# Patient Record
Sex: Male | Born: 1935
Health system: Southern US, Community
[De-identification: ages and names within clinical notes are randomized; demographics above are authoritative.]

## PROBLEM LIST (undated history)

## (undated) DIAGNOSIS — T8859XA Other complications of anesthesia, initial encounter: Secondary | ICD-10-CM

## (undated) DIAGNOSIS — E119 Type 2 diabetes mellitus without complications: Secondary | ICD-10-CM

## (undated) DIAGNOSIS — F0390 Unspecified dementia without behavioral disturbance: Secondary | ICD-10-CM

## (undated) DIAGNOSIS — T4145XA Adverse effect of unspecified anesthetic, initial encounter: Secondary | ICD-10-CM

## (undated) DIAGNOSIS — I509 Heart failure, unspecified: Secondary | ICD-10-CM

## (undated) DIAGNOSIS — R0600 Dyspnea, unspecified: Secondary | ICD-10-CM

## (undated) DIAGNOSIS — I4891 Unspecified atrial fibrillation: Secondary | ICD-10-CM

## (undated) DIAGNOSIS — I639 Cerebral infarction, unspecified: Secondary | ICD-10-CM

## (undated) HISTORY — PX: CARDIAC CATHETERIZATION: SHX172

## (undated) HISTORY — PX: COLONOSCOPY: SHX174

## (undated) HISTORY — PX: CATARACT EXTRACTION: SUR2

## (undated) HISTORY — PX: TONSILLECTOMY: SUR1361

## (undated) HISTORY — PX: APPENDECTOMY: SHX54

---

## 2005-03-20 ENCOUNTER — Ambulatory Visit: Payer: Self-pay | Admitting: Gastroenterology

## 2005-04-07 ENCOUNTER — Ambulatory Visit: Payer: Self-pay | Admitting: Gastroenterology

## 2012-03-31 ENCOUNTER — Encounter: Payer: Self-pay | Admitting: Gastroenterology

## 2012-12-29 ENCOUNTER — Encounter: Payer: Self-pay | Admitting: Gastroenterology

## 2014-10-03 DIAGNOSIS — C44311 Basal cell carcinoma of skin of nose: Secondary | ICD-10-CM | POA: Diagnosis not present

## 2014-10-03 DIAGNOSIS — L219 Seborrheic dermatitis, unspecified: Secondary | ICD-10-CM | POA: Diagnosis not present

## 2014-11-05 DIAGNOSIS — C44112 Basal cell carcinoma of skin of right eyelid, including canthus: Secondary | ICD-10-CM | POA: Diagnosis not present

## 2014-11-05 DIAGNOSIS — C44311 Basal cell carcinoma of skin of nose: Secondary | ICD-10-CM | POA: Diagnosis not present

## 2014-11-05 DIAGNOSIS — L570 Actinic keratosis: Secondary | ICD-10-CM | POA: Diagnosis not present

## 2014-11-26 DIAGNOSIS — L821 Other seborrheic keratosis: Secondary | ICD-10-CM | POA: Diagnosis not present

## 2014-11-26 DIAGNOSIS — L570 Actinic keratosis: Secondary | ICD-10-CM | POA: Diagnosis not present

## 2014-11-26 DIAGNOSIS — C44311 Basal cell carcinoma of skin of nose: Secondary | ICD-10-CM | POA: Diagnosis not present

## 2014-11-26 DIAGNOSIS — C44112 Basal cell carcinoma of skin of right eyelid, including canthus: Secondary | ICD-10-CM | POA: Diagnosis not present

## 2014-12-13 DIAGNOSIS — I10 Essential (primary) hypertension: Secondary | ICD-10-CM | POA: Diagnosis not present

## 2014-12-13 DIAGNOSIS — E785 Hyperlipidemia, unspecified: Secondary | ICD-10-CM | POA: Diagnosis not present

## 2014-12-13 DIAGNOSIS — E119 Type 2 diabetes mellitus without complications: Secondary | ICD-10-CM | POA: Diagnosis not present

## 2014-12-24 DIAGNOSIS — E119 Type 2 diabetes mellitus without complications: Secondary | ICD-10-CM | POA: Diagnosis not present

## 2014-12-24 DIAGNOSIS — I10 Essential (primary) hypertension: Secondary | ICD-10-CM | POA: Diagnosis not present

## 2015-03-12 DIAGNOSIS — C61 Malignant neoplasm of prostate: Secondary | ICD-10-CM | POA: Diagnosis not present

## 2015-03-19 DIAGNOSIS — C61 Malignant neoplasm of prostate: Secondary | ICD-10-CM | POA: Diagnosis not present

## 2015-03-21 DIAGNOSIS — I10 Essential (primary) hypertension: Secondary | ICD-10-CM | POA: Diagnosis not present

## 2015-05-16 DIAGNOSIS — I5022 Chronic systolic (congestive) heart failure: Secondary | ICD-10-CM | POA: Diagnosis not present

## 2015-05-16 DIAGNOSIS — I429 Cardiomyopathy, unspecified: Secondary | ICD-10-CM | POA: Diagnosis not present

## 2015-05-16 DIAGNOSIS — I10 Essential (primary) hypertension: Secondary | ICD-10-CM | POA: Diagnosis not present

## 2015-05-16 DIAGNOSIS — I209 Angina pectoris, unspecified: Secondary | ICD-10-CM | POA: Diagnosis not present

## 2015-05-16 DIAGNOSIS — I2510 Atherosclerotic heart disease of native coronary artery without angina pectoris: Secondary | ICD-10-CM | POA: Diagnosis not present

## 2015-06-06 DIAGNOSIS — I083 Combined rheumatic disorders of mitral, aortic and tricuspid valves: Secondary | ICD-10-CM | POA: Diagnosis not present

## 2015-06-06 DIAGNOSIS — I501 Left ventricular failure: Secondary | ICD-10-CM | POA: Diagnosis not present

## 2015-06-26 DIAGNOSIS — Z23 Encounter for immunization: Secondary | ICD-10-CM | POA: Diagnosis not present

## 2015-06-26 DIAGNOSIS — Z0000 Encounter for general adult medical examination without abnormal findings: Secondary | ICD-10-CM | POA: Diagnosis not present

## 2015-06-26 DIAGNOSIS — E782 Mixed hyperlipidemia: Secondary | ICD-10-CM | POA: Diagnosis not present

## 2015-06-26 DIAGNOSIS — Z1389 Encounter for screening for other disorder: Secondary | ICD-10-CM | POA: Diagnosis not present

## 2015-06-26 DIAGNOSIS — I10 Essential (primary) hypertension: Secondary | ICD-10-CM | POA: Diagnosis not present

## 2015-06-26 DIAGNOSIS — I2510 Atherosclerotic heart disease of native coronary artery without angina pectoris: Secondary | ICD-10-CM | POA: Diagnosis not present

## 2015-06-26 DIAGNOSIS — E1129 Type 2 diabetes mellitus with other diabetic kidney complication: Secondary | ICD-10-CM | POA: Diagnosis not present

## 2015-10-01 DIAGNOSIS — C61 Malignant neoplasm of prostate: Secondary | ICD-10-CM | POA: Diagnosis not present

## 2015-10-08 DIAGNOSIS — C61 Malignant neoplasm of prostate: Secondary | ICD-10-CM | POA: Diagnosis not present

## 2015-10-09 DIAGNOSIS — I5022 Chronic systolic (congestive) heart failure: Secondary | ICD-10-CM | POA: Diagnosis not present

## 2015-10-09 DIAGNOSIS — E1129 Type 2 diabetes mellitus with other diabetic kidney complication: Secondary | ICD-10-CM | POA: Diagnosis not present

## 2015-10-09 DIAGNOSIS — R809 Proteinuria, unspecified: Secondary | ICD-10-CM | POA: Diagnosis not present

## 2015-10-09 DIAGNOSIS — E7800 Pure hypercholesterolemia, unspecified: Secondary | ICD-10-CM | POA: Diagnosis not present

## 2015-10-23 DIAGNOSIS — I5022 Chronic systolic (congestive) heart failure: Secondary | ICD-10-CM | POA: Diagnosis not present

## 2015-11-27 DIAGNOSIS — L821 Other seborrheic keratosis: Secondary | ICD-10-CM | POA: Diagnosis not present

## 2015-11-27 DIAGNOSIS — L578 Other skin changes due to chronic exposure to nonionizing radiation: Secondary | ICD-10-CM | POA: Diagnosis not present

## 2015-11-27 DIAGNOSIS — D1801 Hemangioma of skin and subcutaneous tissue: Secondary | ICD-10-CM | POA: Diagnosis not present

## 2015-11-27 DIAGNOSIS — L820 Inflamed seborrheic keratosis: Secondary | ICD-10-CM | POA: Diagnosis not present

## 2016-01-22 DIAGNOSIS — K219 Gastro-esophageal reflux disease without esophagitis: Secondary | ICD-10-CM | POA: Diagnosis not present

## 2016-01-22 DIAGNOSIS — E7800 Pure hypercholesterolemia, unspecified: Secondary | ICD-10-CM | POA: Diagnosis not present

## 2016-01-22 DIAGNOSIS — E1129 Type 2 diabetes mellitus with other diabetic kidney complication: Secondary | ICD-10-CM | POA: Diagnosis not present

## 2016-01-22 DIAGNOSIS — I5022 Chronic systolic (congestive) heart failure: Secondary | ICD-10-CM | POA: Diagnosis not present

## 2016-01-22 DIAGNOSIS — R809 Proteinuria, unspecified: Secondary | ICD-10-CM | POA: Diagnosis not present

## 2016-02-19 DIAGNOSIS — I5022 Chronic systolic (congestive) heart failure: Secondary | ICD-10-CM | POA: Diagnosis not present

## 2016-02-19 DIAGNOSIS — I208 Other forms of angina pectoris: Secondary | ICD-10-CM | POA: Diagnosis not present

## 2016-02-19 DIAGNOSIS — I493 Ventricular premature depolarization: Secondary | ICD-10-CM | POA: Diagnosis not present

## 2016-02-19 DIAGNOSIS — I10 Essential (primary) hypertension: Secondary | ICD-10-CM | POA: Diagnosis not present

## 2016-02-19 DIAGNOSIS — I2510 Atherosclerotic heart disease of native coronary artery without angina pectoris: Secondary | ICD-10-CM | POA: Diagnosis not present

## 2016-02-20 DIAGNOSIS — I5022 Chronic systolic (congestive) heart failure: Secondary | ICD-10-CM | POA: Diagnosis not present

## 2016-02-20 DIAGNOSIS — I2510 Atherosclerotic heart disease of native coronary artery without angina pectoris: Secondary | ICD-10-CM | POA: Diagnosis not present

## 2016-02-20 DIAGNOSIS — Z01818 Encounter for other preprocedural examination: Secondary | ICD-10-CM | POA: Diagnosis not present

## 2016-02-21 DIAGNOSIS — Z955 Presence of coronary angioplasty implant and graft: Secondary | ICD-10-CM | POA: Diagnosis not present

## 2016-02-21 DIAGNOSIS — I447 Left bundle-branch block, unspecified: Secondary | ICD-10-CM | POA: Diagnosis not present

## 2016-02-21 DIAGNOSIS — R569 Unspecified convulsions: Secondary | ICD-10-CM | POA: Diagnosis not present

## 2016-02-21 DIAGNOSIS — R5381 Other malaise: Secondary | ICD-10-CM | POA: Diagnosis not present

## 2016-02-21 DIAGNOSIS — Z79818 Long term (current) use of other agents affecting estrogen receptors and estrogen levels: Secondary | ICD-10-CM | POA: Diagnosis not present

## 2016-02-21 DIAGNOSIS — R079 Chest pain, unspecified: Secondary | ICD-10-CM | POA: Diagnosis not present

## 2016-02-21 DIAGNOSIS — E876 Hypokalemia: Secondary | ICD-10-CM | POA: Diagnosis not present

## 2016-02-21 DIAGNOSIS — I2510 Atherosclerotic heart disease of native coronary artery without angina pectoris: Secondary | ICD-10-CM | POA: Diagnosis not present

## 2016-02-21 DIAGNOSIS — R4182 Altered mental status, unspecified: Secondary | ICD-10-CM | POA: Diagnosis not present

## 2016-02-21 DIAGNOSIS — E871 Hypo-osmolality and hyponatremia: Secondary | ICD-10-CM | POA: Diagnosis not present

## 2016-02-21 DIAGNOSIS — I10 Essential (primary) hypertension: Secondary | ICD-10-CM | POA: Diagnosis not present

## 2016-02-21 DIAGNOSIS — E118 Type 2 diabetes mellitus with unspecified complications: Secondary | ICD-10-CM | POA: Diagnosis not present

## 2016-02-21 DIAGNOSIS — R413 Other amnesia: Secondary | ICD-10-CM | POA: Diagnosis not present

## 2016-02-21 DIAGNOSIS — Z7982 Long term (current) use of aspirin: Secondary | ICD-10-CM | POA: Diagnosis not present

## 2016-02-21 DIAGNOSIS — I639 Cerebral infarction, unspecified: Secondary | ICD-10-CM | POA: Diagnosis not present

## 2016-02-21 DIAGNOSIS — I110 Hypertensive heart disease with heart failure: Secondary | ICD-10-CM | POA: Diagnosis not present

## 2016-02-21 DIAGNOSIS — E785 Hyperlipidemia, unspecified: Secondary | ICD-10-CM | POA: Diagnosis not present

## 2016-02-21 DIAGNOSIS — Z7902 Long term (current) use of antithrombotics/antiplatelets: Secondary | ICD-10-CM | POA: Diagnosis not present

## 2016-02-21 DIAGNOSIS — E119 Type 2 diabetes mellitus without complications: Secondary | ICD-10-CM | POA: Diagnosis not present

## 2016-02-21 DIAGNOSIS — I420 Dilated cardiomyopathy: Secondary | ICD-10-CM | POA: Diagnosis not present

## 2016-02-21 DIAGNOSIS — D539 Nutritional anemia, unspecified: Secondary | ICD-10-CM | POA: Diagnosis not present

## 2016-02-21 DIAGNOSIS — R000 Tachycardia, unspecified: Secondary | ICD-10-CM | POA: Diagnosis not present

## 2016-02-21 DIAGNOSIS — R410 Disorientation, unspecified: Secondary | ICD-10-CM | POA: Diagnosis not present

## 2016-02-21 DIAGNOSIS — Z452 Encounter for adjustment and management of vascular access device: Secondary | ICD-10-CM | POA: Diagnosis not present

## 2016-02-21 DIAGNOSIS — Z79899 Other long term (current) drug therapy: Secondary | ICD-10-CM | POA: Diagnosis not present

## 2016-02-21 DIAGNOSIS — E7800 Pure hypercholesterolemia, unspecified: Secondary | ICD-10-CM | POA: Diagnosis not present

## 2016-02-21 DIAGNOSIS — I493 Ventricular premature depolarization: Secondary | ICD-10-CM | POA: Diagnosis not present

## 2016-02-21 DIAGNOSIS — I5022 Chronic systolic (congestive) heart failure: Secondary | ICD-10-CM | POA: Diagnosis not present

## 2016-02-21 DIAGNOSIS — Z7984 Long term (current) use of oral hypoglycemic drugs: Secondary | ICD-10-CM | POA: Diagnosis not present

## 2016-02-21 DIAGNOSIS — Z8673 Personal history of transient ischemic attack (TIA), and cerebral infarction without residual deficits: Secondary | ICD-10-CM | POA: Diagnosis not present

## 2016-02-21 DIAGNOSIS — I25119 Atherosclerotic heart disease of native coronary artery with unspecified angina pectoris: Secondary | ICD-10-CM | POA: Diagnosis not present

## 2016-02-21 DIAGNOSIS — I25118 Atherosclerotic heart disease of native coronary artery with other forms of angina pectoris: Secondary | ICD-10-CM | POA: Diagnosis not present

## 2016-02-25 DIAGNOSIS — I69318 Other symptoms and signs involving cognitive functions following cerebral infarction: Secondary | ICD-10-CM | POA: Diagnosis not present

## 2016-02-25 DIAGNOSIS — I2510 Atherosclerotic heart disease of native coronary artery without angina pectoris: Secondary | ICD-10-CM | POA: Diagnosis not present

## 2016-02-25 DIAGNOSIS — Z7984 Long term (current) use of oral hypoglycemic drugs: Secondary | ICD-10-CM | POA: Diagnosis not present

## 2016-02-25 DIAGNOSIS — I110 Hypertensive heart disease with heart failure: Secondary | ICD-10-CM | POA: Diagnosis not present

## 2016-02-25 DIAGNOSIS — E119 Type 2 diabetes mellitus without complications: Secondary | ICD-10-CM | POA: Diagnosis not present

## 2016-02-25 DIAGNOSIS — I5022 Chronic systolic (congestive) heart failure: Secondary | ICD-10-CM | POA: Diagnosis not present

## 2016-02-25 DIAGNOSIS — Z7982 Long term (current) use of aspirin: Secondary | ICD-10-CM | POA: Diagnosis not present

## 2016-02-27 DIAGNOSIS — I5022 Chronic systolic (congestive) heart failure: Secondary | ICD-10-CM | POA: Diagnosis not present

## 2016-02-27 DIAGNOSIS — I69318 Other symptoms and signs involving cognitive functions following cerebral infarction: Secondary | ICD-10-CM | POA: Diagnosis not present

## 2016-02-27 DIAGNOSIS — E119 Type 2 diabetes mellitus without complications: Secondary | ICD-10-CM | POA: Diagnosis not present

## 2016-02-27 DIAGNOSIS — Z7984 Long term (current) use of oral hypoglycemic drugs: Secondary | ICD-10-CM | POA: Diagnosis not present

## 2016-02-27 DIAGNOSIS — Z7982 Long term (current) use of aspirin: Secondary | ICD-10-CM | POA: Diagnosis not present

## 2016-02-27 DIAGNOSIS — I2510 Atherosclerotic heart disease of native coronary artery without angina pectoris: Secondary | ICD-10-CM | POA: Diagnosis not present

## 2016-02-27 DIAGNOSIS — I110 Hypertensive heart disease with heart failure: Secondary | ICD-10-CM | POA: Diagnosis not present

## 2016-03-02 DIAGNOSIS — I5022 Chronic systolic (congestive) heart failure: Secondary | ICD-10-CM | POA: Diagnosis not present

## 2016-03-02 DIAGNOSIS — I2510 Atherosclerotic heart disease of native coronary artery without angina pectoris: Secondary | ICD-10-CM | POA: Diagnosis not present

## 2016-03-02 DIAGNOSIS — I69318 Other symptoms and signs involving cognitive functions following cerebral infarction: Secondary | ICD-10-CM | POA: Diagnosis not present

## 2016-03-02 DIAGNOSIS — E119 Type 2 diabetes mellitus without complications: Secondary | ICD-10-CM | POA: Diagnosis not present

## 2016-03-02 DIAGNOSIS — Z7982 Long term (current) use of aspirin: Secondary | ICD-10-CM | POA: Diagnosis not present

## 2016-03-02 DIAGNOSIS — I110 Hypertensive heart disease with heart failure: Secondary | ICD-10-CM | POA: Diagnosis not present

## 2016-03-02 DIAGNOSIS — Z7984 Long term (current) use of oral hypoglycemic drugs: Secondary | ICD-10-CM | POA: Diagnosis not present

## 2016-03-03 DIAGNOSIS — I69318 Other symptoms and signs involving cognitive functions following cerebral infarction: Secondary | ICD-10-CM | POA: Diagnosis not present

## 2016-03-03 DIAGNOSIS — I679 Cerebrovascular disease, unspecified: Secondary | ICD-10-CM | POA: Diagnosis not present

## 2016-03-03 DIAGNOSIS — I2510 Atherosclerotic heart disease of native coronary artery without angina pectoris: Secondary | ICD-10-CM | POA: Diagnosis not present

## 2016-03-03 DIAGNOSIS — I5022 Chronic systolic (congestive) heart failure: Secondary | ICD-10-CM | POA: Diagnosis not present

## 2016-03-03 DIAGNOSIS — I110 Hypertensive heart disease with heart failure: Secondary | ICD-10-CM | POA: Diagnosis not present

## 2016-03-03 DIAGNOSIS — Z7984 Long term (current) use of oral hypoglycemic drugs: Secondary | ICD-10-CM | POA: Diagnosis not present

## 2016-03-03 DIAGNOSIS — E119 Type 2 diabetes mellitus without complications: Secondary | ICD-10-CM | POA: Diagnosis not present

## 2016-03-03 DIAGNOSIS — Z7982 Long term (current) use of aspirin: Secondary | ICD-10-CM | POA: Diagnosis not present

## 2016-03-11 DIAGNOSIS — Z79899 Other long term (current) drug therapy: Secondary | ICD-10-CM | POA: Diagnosis not present

## 2016-03-11 DIAGNOSIS — R4182 Altered mental status, unspecified: Secondary | ICD-10-CM | POA: Diagnosis not present

## 2016-03-11 DIAGNOSIS — E119 Type 2 diabetes mellitus without complications: Secondary | ICD-10-CM | POA: Diagnosis not present

## 2016-03-11 DIAGNOSIS — I5022 Chronic systolic (congestive) heart failure: Secondary | ICD-10-CM | POA: Diagnosis not present

## 2016-03-11 DIAGNOSIS — I69318 Other symptoms and signs involving cognitive functions following cerebral infarction: Secondary | ICD-10-CM | POA: Diagnosis not present

## 2016-03-11 DIAGNOSIS — E860 Dehydration: Secondary | ICD-10-CM | POA: Diagnosis not present

## 2016-03-11 DIAGNOSIS — I110 Hypertensive heart disease with heart failure: Secondary | ICD-10-CM | POA: Diagnosis not present

## 2016-03-11 DIAGNOSIS — R402411 Glasgow coma scale score 13-15, in the field [EMT or ambulance]: Secondary | ICD-10-CM | POA: Diagnosis not present

## 2016-03-11 DIAGNOSIS — Z7982 Long term (current) use of aspirin: Secondary | ICD-10-CM | POA: Diagnosis not present

## 2016-03-11 DIAGNOSIS — I10 Essential (primary) hypertension: Secondary | ICD-10-CM | POA: Diagnosis not present

## 2016-03-11 DIAGNOSIS — G459 Transient cerebral ischemic attack, unspecified: Secondary | ICD-10-CM | POA: Diagnosis not present

## 2016-03-11 DIAGNOSIS — Z7984 Long term (current) use of oral hypoglycemic drugs: Secondary | ICD-10-CM | POA: Diagnosis not present

## 2016-03-11 DIAGNOSIS — I2510 Atherosclerotic heart disease of native coronary artery without angina pectoris: Secondary | ICD-10-CM | POA: Diagnosis not present

## 2016-03-13 DIAGNOSIS — I679 Cerebrovascular disease, unspecified: Secondary | ICD-10-CM | POA: Diagnosis not present

## 2016-03-17 DIAGNOSIS — I2510 Atherosclerotic heart disease of native coronary artery without angina pectoris: Secondary | ICD-10-CM | POA: Diagnosis not present

## 2016-03-17 DIAGNOSIS — R0989 Other specified symptoms and signs involving the circulatory and respiratory systems: Secondary | ICD-10-CM | POA: Diagnosis not present

## 2016-03-17 DIAGNOSIS — I429 Cardiomyopathy, unspecified: Secondary | ICD-10-CM | POA: Diagnosis not present

## 2016-03-17 DIAGNOSIS — I493 Ventricular premature depolarization: Secondary | ICD-10-CM | POA: Diagnosis not present

## 2016-03-18 DIAGNOSIS — I2510 Atherosclerotic heart disease of native coronary artery without angina pectoris: Secondary | ICD-10-CM | POA: Diagnosis not present

## 2016-03-18 DIAGNOSIS — I429 Cardiomyopathy, unspecified: Secondary | ICD-10-CM | POA: Diagnosis not present

## 2016-03-18 DIAGNOSIS — I493 Ventricular premature depolarization: Secondary | ICD-10-CM | POA: Diagnosis not present

## 2016-03-18 DIAGNOSIS — R0989 Other specified symptoms and signs involving the circulatory and respiratory systems: Secondary | ICD-10-CM | POA: Diagnosis not present

## 2016-03-19 DIAGNOSIS — I69318 Other symptoms and signs involving cognitive functions following cerebral infarction: Secondary | ICD-10-CM | POA: Diagnosis not present

## 2016-03-19 DIAGNOSIS — I110 Hypertensive heart disease with heart failure: Secondary | ICD-10-CM | POA: Diagnosis not present

## 2016-03-19 DIAGNOSIS — E119 Type 2 diabetes mellitus without complications: Secondary | ICD-10-CM | POA: Diagnosis not present

## 2016-03-19 DIAGNOSIS — I5022 Chronic systolic (congestive) heart failure: Secondary | ICD-10-CM | POA: Diagnosis not present

## 2016-03-19 DIAGNOSIS — I2510 Atherosclerotic heart disease of native coronary artery without angina pectoris: Secondary | ICD-10-CM | POA: Diagnosis not present

## 2016-03-19 DIAGNOSIS — Z7982 Long term (current) use of aspirin: Secondary | ICD-10-CM | POA: Diagnosis not present

## 2016-03-19 DIAGNOSIS — Z7984 Long term (current) use of oral hypoglycemic drugs: Secondary | ICD-10-CM | POA: Diagnosis not present

## 2016-03-31 DIAGNOSIS — Z7984 Long term (current) use of oral hypoglycemic drugs: Secondary | ICD-10-CM | POA: Diagnosis not present

## 2016-03-31 DIAGNOSIS — I5022 Chronic systolic (congestive) heart failure: Secondary | ICD-10-CM | POA: Diagnosis not present

## 2016-03-31 DIAGNOSIS — Z7982 Long term (current) use of aspirin: Secondary | ICD-10-CM | POA: Diagnosis not present

## 2016-03-31 DIAGNOSIS — E119 Type 2 diabetes mellitus without complications: Secondary | ICD-10-CM | POA: Diagnosis not present

## 2016-03-31 DIAGNOSIS — I2510 Atherosclerotic heart disease of native coronary artery without angina pectoris: Secondary | ICD-10-CM | POA: Diagnosis not present

## 2016-03-31 DIAGNOSIS — I69318 Other symptoms and signs involving cognitive functions following cerebral infarction: Secondary | ICD-10-CM | POA: Diagnosis not present

## 2016-03-31 DIAGNOSIS — I110 Hypertensive heart disease with heart failure: Secondary | ICD-10-CM | POA: Diagnosis not present

## 2016-04-08 DIAGNOSIS — I429 Cardiomyopathy, unspecified: Secondary | ICD-10-CM | POA: Diagnosis not present

## 2016-04-08 DIAGNOSIS — R9431 Abnormal electrocardiogram [ECG] [EKG]: Secondary | ICD-10-CM | POA: Diagnosis not present

## 2016-04-08 DIAGNOSIS — I6522 Occlusion and stenosis of left carotid artery: Secondary | ICD-10-CM | POA: Diagnosis not present

## 2016-04-08 DIAGNOSIS — I6300 Cerebral infarction due to thrombosis of unspecified precerebral artery: Secondary | ICD-10-CM | POA: Diagnosis not present

## 2016-04-08 DIAGNOSIS — R413 Other amnesia: Secondary | ICD-10-CM | POA: Diagnosis not present

## 2016-04-08 DIAGNOSIS — R938 Abnormal findings on diagnostic imaging of other specified body structures: Secondary | ICD-10-CM | POA: Diagnosis not present

## 2016-04-14 DIAGNOSIS — C61 Malignant neoplasm of prostate: Secondary | ICD-10-CM | POA: Diagnosis not present

## 2016-04-17 ENCOUNTER — Other Ambulatory Visit (HOSPITAL_COMMUNITY): Payer: Self-pay | Admitting: Cardiology

## 2016-04-17 ENCOUNTER — Telehealth (HOSPITAL_COMMUNITY): Payer: Self-pay | Admitting: Interventional Radiology

## 2016-04-17 DIAGNOSIS — I771 Stricture of artery: Secondary | ICD-10-CM

## 2016-04-17 NOTE — Telephone Encounter (Signed)
Called pt to schedule consult with Dev for stenosis, left VM. JM

## 2016-04-22 DIAGNOSIS — C61 Malignant neoplasm of prostate: Secondary | ICD-10-CM | POA: Diagnosis not present

## 2016-04-22 DIAGNOSIS — I779 Disorder of arteries and arterioles, unspecified: Secondary | ICD-10-CM | POA: Diagnosis not present

## 2016-04-27 DIAGNOSIS — E1151 Type 2 diabetes mellitus with diabetic peripheral angiopathy without gangrene: Secondary | ICD-10-CM | POA: Diagnosis not present

## 2016-04-27 DIAGNOSIS — E785 Hyperlipidemia, unspecified: Secondary | ICD-10-CM | POA: Diagnosis not present

## 2016-04-27 DIAGNOSIS — I871 Compression of vein: Secondary | ICD-10-CM | POA: Diagnosis not present

## 2016-04-27 DIAGNOSIS — I10 Essential (primary) hypertension: Secondary | ICD-10-CM | POA: Diagnosis not present

## 2016-04-27 DIAGNOSIS — I70213 Atherosclerosis of native arteries of extremities with intermittent claudication, bilateral legs: Secondary | ICD-10-CM | POA: Diagnosis not present

## 2016-04-28 ENCOUNTER — Telehealth (HOSPITAL_COMMUNITY): Payer: Self-pay | Admitting: Interventional Radiology

## 2016-04-28 NOTE — Telephone Encounter (Signed)
Called pt, spoke to pt's wife. She stated that the pt had seen Dr. Maryjean Morn in [facility] and does not wish to see Dr. Estanislado Pandy. JM

## 2016-05-19 DIAGNOSIS — I5022 Chronic systolic (congestive) heart failure: Secondary | ICD-10-CM | POA: Diagnosis not present

## 2016-05-19 DIAGNOSIS — Z23 Encounter for immunization: Secondary | ICD-10-CM | POA: Diagnosis not present

## 2016-05-19 DIAGNOSIS — E119 Type 2 diabetes mellitus without complications: Secondary | ICD-10-CM | POA: Diagnosis not present

## 2016-05-19 DIAGNOSIS — I10 Essential (primary) hypertension: Secondary | ICD-10-CM | POA: Diagnosis not present

## 2016-05-20 DIAGNOSIS — I639 Cerebral infarction, unspecified: Secondary | ICD-10-CM | POA: Diagnosis not present

## 2016-05-20 DIAGNOSIS — R413 Other amnesia: Secondary | ICD-10-CM | POA: Diagnosis not present

## 2016-06-08 DIAGNOSIS — I871 Compression of vein: Secondary | ICD-10-CM | POA: Diagnosis not present

## 2016-06-08 DIAGNOSIS — I70213 Atherosclerosis of native arteries of extremities with intermittent claudication, bilateral legs: Secondary | ICD-10-CM | POA: Diagnosis not present

## 2016-06-08 DIAGNOSIS — R0989 Other specified symptoms and signs involving the circulatory and respiratory systems: Secondary | ICD-10-CM | POA: Diagnosis not present

## 2016-06-08 DIAGNOSIS — I6523 Occlusion and stenosis of bilateral carotid arteries: Secondary | ICD-10-CM | POA: Diagnosis not present

## 2016-06-17 DIAGNOSIS — R0989 Other specified symptoms and signs involving the circulatory and respiratory systems: Secondary | ICD-10-CM | POA: Diagnosis not present

## 2016-06-17 DIAGNOSIS — I420 Dilated cardiomyopathy: Secondary | ICD-10-CM | POA: Diagnosis not present

## 2016-06-17 DIAGNOSIS — I5022 Chronic systolic (congestive) heart failure: Secondary | ICD-10-CM | POA: Diagnosis not present

## 2016-06-17 DIAGNOSIS — I2510 Atherosclerotic heart disease of native coronary artery without angina pectoris: Secondary | ICD-10-CM | POA: Diagnosis not present

## 2016-06-17 DIAGNOSIS — R972 Elevated prostate specific antigen [PSA]: Secondary | ICD-10-CM | POA: Diagnosis not present

## 2016-07-31 DIAGNOSIS — R339 Retention of urine, unspecified: Secondary | ICD-10-CM | POA: Diagnosis not present

## 2016-08-19 DIAGNOSIS — E7800 Pure hypercholesterolemia, unspecified: Secondary | ICD-10-CM | POA: Diagnosis not present

## 2016-08-19 DIAGNOSIS — I10 Essential (primary) hypertension: Secondary | ICD-10-CM | POA: Diagnosis not present

## 2016-08-19 DIAGNOSIS — E119 Type 2 diabetes mellitus without complications: Secondary | ICD-10-CM | POA: Diagnosis not present

## 2016-08-19 DIAGNOSIS — I2510 Atherosclerotic heart disease of native coronary artery without angina pectoris: Secondary | ICD-10-CM | POA: Diagnosis not present

## 2016-08-19 DIAGNOSIS — E1129 Type 2 diabetes mellitus with other diabetic kidney complication: Secondary | ICD-10-CM | POA: Diagnosis not present

## 2016-08-19 DIAGNOSIS — Z1389 Encounter for screening for other disorder: Secondary | ICD-10-CM | POA: Diagnosis not present

## 2016-08-19 DIAGNOSIS — K219 Gastro-esophageal reflux disease without esophagitis: Secondary | ICD-10-CM | POA: Diagnosis not present

## 2016-08-19 DIAGNOSIS — Z0000 Encounter for general adult medical examination without abnormal findings: Secondary | ICD-10-CM | POA: Diagnosis not present

## 2016-08-27 DIAGNOSIS — M170 Bilateral primary osteoarthritis of knee: Secondary | ICD-10-CM | POA: Diagnosis not present

## 2016-09-01 DIAGNOSIS — D649 Anemia, unspecified: Secondary | ICD-10-CM | POA: Diagnosis not present

## 2016-09-02 DIAGNOSIS — Z1212 Encounter for screening for malignant neoplasm of rectum: Secondary | ICD-10-CM | POA: Diagnosis not present

## 2016-09-02 DIAGNOSIS — M170 Bilateral primary osteoarthritis of knee: Secondary | ICD-10-CM | POA: Diagnosis not present

## 2016-10-28 DIAGNOSIS — I871 Compression of vein: Secondary | ICD-10-CM | POA: Diagnosis not present

## 2016-10-28 DIAGNOSIS — I6523 Occlusion and stenosis of bilateral carotid arteries: Secondary | ICD-10-CM | POA: Diagnosis not present

## 2016-11-19 DIAGNOSIS — I472 Ventricular tachycardia: Secondary | ICD-10-CM | POA: Diagnosis not present

## 2016-11-19 DIAGNOSIS — I10 Essential (primary) hypertension: Secondary | ICD-10-CM | POA: Diagnosis not present

## 2016-11-19 DIAGNOSIS — I420 Dilated cardiomyopathy: Secondary | ICD-10-CM | POA: Diagnosis not present

## 2016-11-25 DIAGNOSIS — I10 Essential (primary) hypertension: Secondary | ICD-10-CM | POA: Diagnosis not present

## 2016-11-25 DIAGNOSIS — D649 Anemia, unspecified: Secondary | ICD-10-CM | POA: Diagnosis not present

## 2016-11-25 DIAGNOSIS — E119 Type 2 diabetes mellitus without complications: Secondary | ICD-10-CM | POA: Diagnosis not present

## 2016-11-25 DIAGNOSIS — E7800 Pure hypercholesterolemia, unspecified: Secondary | ICD-10-CM | POA: Diagnosis not present

## 2016-12-02 DIAGNOSIS — L821 Other seborrheic keratosis: Secondary | ICD-10-CM | POA: Diagnosis not present

## 2016-12-02 DIAGNOSIS — C44311 Basal cell carcinoma of skin of nose: Secondary | ICD-10-CM | POA: Diagnosis not present

## 2016-12-02 DIAGNOSIS — L578 Other skin changes due to chronic exposure to nonionizing radiation: Secondary | ICD-10-CM | POA: Diagnosis not present

## 2016-12-02 DIAGNOSIS — L570 Actinic keratosis: Secondary | ICD-10-CM | POA: Diagnosis not present

## 2016-12-15 DIAGNOSIS — C61 Malignant neoplasm of prostate: Secondary | ICD-10-CM | POA: Diagnosis not present

## 2016-12-22 DIAGNOSIS — C61 Malignant neoplasm of prostate: Secondary | ICD-10-CM | POA: Diagnosis not present

## 2016-12-24 DIAGNOSIS — D649 Anemia, unspecified: Secondary | ICD-10-CM | POA: Diagnosis not present

## 2016-12-24 DIAGNOSIS — D510 Vitamin B12 deficiency anemia due to intrinsic factor deficiency: Secondary | ICD-10-CM | POA: Diagnosis not present

## 2016-12-24 DIAGNOSIS — K210 Gastro-esophageal reflux disease with esophagitis: Secondary | ICD-10-CM | POA: Diagnosis not present

## 2016-12-29 DIAGNOSIS — D500 Iron deficiency anemia secondary to blood loss (chronic): Secondary | ICD-10-CM | POA: Diagnosis not present

## 2017-01-05 DIAGNOSIS — D500 Iron deficiency anemia secondary to blood loss (chronic): Secondary | ICD-10-CM | POA: Diagnosis not present

## 2017-01-12 DIAGNOSIS — D500 Iron deficiency anemia secondary to blood loss (chronic): Secondary | ICD-10-CM | POA: Diagnosis not present

## 2017-01-19 DIAGNOSIS — D500 Iron deficiency anemia secondary to blood loss (chronic): Secondary | ICD-10-CM | POA: Diagnosis not present

## 2017-01-20 DIAGNOSIS — I871 Compression of vein: Secondary | ICD-10-CM | POA: Diagnosis not present

## 2017-01-20 DIAGNOSIS — E785 Hyperlipidemia, unspecified: Secondary | ICD-10-CM | POA: Diagnosis not present

## 2017-01-20 DIAGNOSIS — E1151 Type 2 diabetes mellitus with diabetic peripheral angiopathy without gangrene: Secondary | ICD-10-CM | POA: Diagnosis not present

## 2017-01-20 DIAGNOSIS — I159 Secondary hypertension, unspecified: Secondary | ICD-10-CM | POA: Diagnosis not present

## 2017-01-20 DIAGNOSIS — I6523 Occlusion and stenosis of bilateral carotid arteries: Secondary | ICD-10-CM | POA: Diagnosis not present

## 2017-01-26 DIAGNOSIS — D649 Anemia, unspecified: Secondary | ICD-10-CM | POA: Diagnosis not present

## 2017-02-23 DIAGNOSIS — D500 Iron deficiency anemia secondary to blood loss (chronic): Secondary | ICD-10-CM | POA: Diagnosis not present

## 2017-02-25 DIAGNOSIS — K5900 Constipation, unspecified: Secondary | ICD-10-CM | POA: Diagnosis not present

## 2017-02-25 DIAGNOSIS — E7800 Pure hypercholesterolemia, unspecified: Secondary | ICD-10-CM | POA: Diagnosis not present

## 2017-02-25 DIAGNOSIS — E119 Type 2 diabetes mellitus without complications: Secondary | ICD-10-CM | POA: Diagnosis not present

## 2017-02-25 DIAGNOSIS — R109 Unspecified abdominal pain: Secondary | ICD-10-CM | POA: Diagnosis not present

## 2017-02-25 DIAGNOSIS — I10 Essential (primary) hypertension: Secondary | ICD-10-CM | POA: Diagnosis not present

## 2017-03-11 DIAGNOSIS — R319 Hematuria, unspecified: Secondary | ICD-10-CM | POA: Diagnosis not present

## 2017-03-30 DIAGNOSIS — D500 Iron deficiency anemia secondary to blood loss (chronic): Secondary | ICD-10-CM | POA: Diagnosis not present

## 2017-04-27 DIAGNOSIS — D500 Iron deficiency anemia secondary to blood loss (chronic): Secondary | ICD-10-CM | POA: Diagnosis not present

## 2017-05-13 DIAGNOSIS — I493 Ventricular premature depolarization: Secondary | ICD-10-CM | POA: Diagnosis not present

## 2017-05-13 DIAGNOSIS — I5022 Chronic systolic (congestive) heart failure: Secondary | ICD-10-CM | POA: Diagnosis not present

## 2017-05-13 DIAGNOSIS — I420 Dilated cardiomyopathy: Secondary | ICD-10-CM | POA: Diagnosis not present

## 2017-05-13 DIAGNOSIS — I10 Essential (primary) hypertension: Secondary | ICD-10-CM | POA: Diagnosis not present

## 2017-05-13 DIAGNOSIS — I2510 Atherosclerotic heart disease of native coronary artery without angina pectoris: Secondary | ICD-10-CM | POA: Diagnosis not present

## 2017-05-25 DIAGNOSIS — D500 Iron deficiency anemia secondary to blood loss (chronic): Secondary | ICD-10-CM | POA: Diagnosis not present

## 2017-05-31 DIAGNOSIS — I10 Essential (primary) hypertension: Secondary | ICD-10-CM | POA: Diagnosis not present

## 2017-05-31 DIAGNOSIS — E7800 Pure hypercholesterolemia, unspecified: Secondary | ICD-10-CM | POA: Diagnosis not present

## 2017-05-31 DIAGNOSIS — E119 Type 2 diabetes mellitus without complications: Secondary | ICD-10-CM | POA: Diagnosis not present

## 2017-05-31 DIAGNOSIS — Z23 Encounter for immunization: Secondary | ICD-10-CM | POA: Diagnosis not present

## 2017-06-22 DIAGNOSIS — D500 Iron deficiency anemia secondary to blood loss (chronic): Secondary | ICD-10-CM | POA: Diagnosis not present

## 2017-07-27 DIAGNOSIS — D500 Iron deficiency anemia secondary to blood loss (chronic): Secondary | ICD-10-CM | POA: Diagnosis not present

## 2017-08-24 DIAGNOSIS — D500 Iron deficiency anemia secondary to blood loss (chronic): Secondary | ICD-10-CM | POA: Diagnosis not present

## 2017-09-09 DIAGNOSIS — E119 Type 2 diabetes mellitus without complications: Secondary | ICD-10-CM | POA: Diagnosis not present

## 2017-09-09 DIAGNOSIS — M170 Bilateral primary osteoarthritis of knee: Secondary | ICD-10-CM | POA: Diagnosis not present

## 2017-09-20 DIAGNOSIS — C61 Malignant neoplasm of prostate: Secondary | ICD-10-CM | POA: Diagnosis not present

## 2017-09-20 DIAGNOSIS — K5900 Constipation, unspecified: Secondary | ICD-10-CM | POA: Diagnosis not present

## 2017-09-28 DIAGNOSIS — D500 Iron deficiency anemia secondary to blood loss (chronic): Secondary | ICD-10-CM | POA: Diagnosis not present

## 2017-10-26 DIAGNOSIS — D500 Iron deficiency anemia secondary to blood loss (chronic): Secondary | ICD-10-CM | POA: Diagnosis not present

## 2017-11-10 DIAGNOSIS — I110 Hypertensive heart disease with heart failure: Secondary | ICD-10-CM | POA: Diagnosis not present

## 2017-11-10 DIAGNOSIS — I5022 Chronic systolic (congestive) heart failure: Secondary | ICD-10-CM | POA: Diagnosis not present

## 2017-11-10 DIAGNOSIS — I2510 Atherosclerotic heart disease of native coronary artery without angina pectoris: Secondary | ICD-10-CM | POA: Diagnosis not present

## 2017-11-10 DIAGNOSIS — I493 Ventricular premature depolarization: Secondary | ICD-10-CM | POA: Diagnosis not present

## 2017-11-10 DIAGNOSIS — Z955 Presence of coronary angioplasty implant and graft: Secondary | ICD-10-CM | POA: Diagnosis not present

## 2017-11-23 DIAGNOSIS — D500 Iron deficiency anemia secondary to blood loss (chronic): Secondary | ICD-10-CM | POA: Diagnosis not present

## 2017-12-01 DIAGNOSIS — L578 Other skin changes due to chronic exposure to nonionizing radiation: Secondary | ICD-10-CM | POA: Diagnosis not present

## 2017-12-01 DIAGNOSIS — L570 Actinic keratosis: Secondary | ICD-10-CM | POA: Diagnosis not present

## 2017-12-01 DIAGNOSIS — L821 Other seborrheic keratosis: Secondary | ICD-10-CM | POA: Diagnosis not present

## 2017-12-09 DIAGNOSIS — E119 Type 2 diabetes mellitus without complications: Secondary | ICD-10-CM | POA: Diagnosis not present

## 2017-12-09 DIAGNOSIS — K5900 Constipation, unspecified: Secondary | ICD-10-CM | POA: Diagnosis not present

## 2017-12-09 DIAGNOSIS — M170 Bilateral primary osteoarthritis of knee: Secondary | ICD-10-CM | POA: Diagnosis not present

## 2017-12-09 DIAGNOSIS — I10 Essential (primary) hypertension: Secondary | ICD-10-CM | POA: Diagnosis not present

## 2017-12-14 DIAGNOSIS — M170 Bilateral primary osteoarthritis of knee: Secondary | ICD-10-CM | POA: Diagnosis not present

## 2017-12-21 DIAGNOSIS — D500 Iron deficiency anemia secondary to blood loss (chronic): Secondary | ICD-10-CM | POA: Diagnosis not present

## 2018-01-18 DIAGNOSIS — D500 Iron deficiency anemia secondary to blood loss (chronic): Secondary | ICD-10-CM | POA: Diagnosis not present

## 2018-02-04 DIAGNOSIS — B370 Candidal stomatitis: Secondary | ICD-10-CM | POA: Diagnosis not present

## 2018-02-04 DIAGNOSIS — J029 Acute pharyngitis, unspecified: Secondary | ICD-10-CM | POA: Diagnosis not present

## 2018-02-15 DIAGNOSIS — D500 Iron deficiency anemia secondary to blood loss (chronic): Secondary | ICD-10-CM | POA: Diagnosis not present

## 2018-03-15 DIAGNOSIS — D500 Iron deficiency anemia secondary to blood loss (chronic): Secondary | ICD-10-CM | POA: Diagnosis not present

## 2018-03-17 DIAGNOSIS — I10 Essential (primary) hypertension: Secondary | ICD-10-CM | POA: Diagnosis not present

## 2018-03-17 DIAGNOSIS — Z0000 Encounter for general adult medical examination without abnormal findings: Secondary | ICD-10-CM | POA: Diagnosis not present

## 2018-03-17 DIAGNOSIS — E538 Deficiency of other specified B group vitamins: Secondary | ICD-10-CM | POA: Diagnosis not present

## 2018-03-17 DIAGNOSIS — I679 Cerebrovascular disease, unspecified: Secondary | ICD-10-CM | POA: Diagnosis not present

## 2018-03-17 DIAGNOSIS — N400 Enlarged prostate without lower urinary tract symptoms: Secondary | ICD-10-CM | POA: Diagnosis not present

## 2018-03-17 DIAGNOSIS — I2510 Atherosclerotic heart disease of native coronary artery without angina pectoris: Secondary | ICD-10-CM | POA: Diagnosis not present

## 2018-03-17 DIAGNOSIS — Z1389 Encounter for screening for other disorder: Secondary | ICD-10-CM | POA: Diagnosis not present

## 2018-03-17 DIAGNOSIS — E7800 Pure hypercholesterolemia, unspecified: Secondary | ICD-10-CM | POA: Diagnosis not present

## 2018-03-17 DIAGNOSIS — E119 Type 2 diabetes mellitus without complications: Secondary | ICD-10-CM | POA: Diagnosis not present

## 2018-03-29 DIAGNOSIS — R262 Difficulty in walking, not elsewhere classified: Secondary | ICD-10-CM | POA: Diagnosis not present

## 2018-03-29 DIAGNOSIS — I679 Cerebrovascular disease, unspecified: Secondary | ICD-10-CM | POA: Diagnosis not present

## 2018-03-29 DIAGNOSIS — R293 Abnormal posture: Secondary | ICD-10-CM | POA: Diagnosis not present

## 2018-03-29 DIAGNOSIS — M6281 Muscle weakness (generalized): Secondary | ICD-10-CM | POA: Diagnosis not present

## 2018-03-31 DIAGNOSIS — R293 Abnormal posture: Secondary | ICD-10-CM | POA: Diagnosis not present

## 2018-03-31 DIAGNOSIS — I679 Cerebrovascular disease, unspecified: Secondary | ICD-10-CM | POA: Diagnosis not present

## 2018-03-31 DIAGNOSIS — R262 Difficulty in walking, not elsewhere classified: Secondary | ICD-10-CM | POA: Diagnosis not present

## 2018-03-31 DIAGNOSIS — M6281 Muscle weakness (generalized): Secondary | ICD-10-CM | POA: Diagnosis not present

## 2018-04-04 DIAGNOSIS — R262 Difficulty in walking, not elsewhere classified: Secondary | ICD-10-CM | POA: Diagnosis not present

## 2018-04-04 DIAGNOSIS — I679 Cerebrovascular disease, unspecified: Secondary | ICD-10-CM | POA: Diagnosis not present

## 2018-04-04 DIAGNOSIS — R293 Abnormal posture: Secondary | ICD-10-CM | POA: Diagnosis not present

## 2018-04-04 DIAGNOSIS — M6281 Muscle weakness (generalized): Secondary | ICD-10-CM | POA: Diagnosis not present

## 2018-04-05 DIAGNOSIS — R293 Abnormal posture: Secondary | ICD-10-CM | POA: Diagnosis not present

## 2018-04-05 DIAGNOSIS — I679 Cerebrovascular disease, unspecified: Secondary | ICD-10-CM | POA: Diagnosis not present

## 2018-04-05 DIAGNOSIS — R262 Difficulty in walking, not elsewhere classified: Secondary | ICD-10-CM | POA: Diagnosis not present

## 2018-04-05 DIAGNOSIS — M6281 Muscle weakness (generalized): Secondary | ICD-10-CM | POA: Diagnosis not present

## 2018-04-07 DIAGNOSIS — M6281 Muscle weakness (generalized): Secondary | ICD-10-CM | POA: Diagnosis not present

## 2018-04-07 DIAGNOSIS — R293 Abnormal posture: Secondary | ICD-10-CM | POA: Diagnosis not present

## 2018-04-07 DIAGNOSIS — R262 Difficulty in walking, not elsewhere classified: Secondary | ICD-10-CM | POA: Diagnosis not present

## 2018-04-07 DIAGNOSIS — I679 Cerebrovascular disease, unspecified: Secondary | ICD-10-CM | POA: Diagnosis not present

## 2018-04-11 DIAGNOSIS — R262 Difficulty in walking, not elsewhere classified: Secondary | ICD-10-CM | POA: Diagnosis not present

## 2018-04-11 DIAGNOSIS — I679 Cerebrovascular disease, unspecified: Secondary | ICD-10-CM | POA: Diagnosis not present

## 2018-04-11 DIAGNOSIS — M6281 Muscle weakness (generalized): Secondary | ICD-10-CM | POA: Diagnosis not present

## 2018-04-11 DIAGNOSIS — R293 Abnormal posture: Secondary | ICD-10-CM | POA: Diagnosis not present

## 2018-04-12 DIAGNOSIS — M6281 Muscle weakness (generalized): Secondary | ICD-10-CM | POA: Diagnosis not present

## 2018-04-12 DIAGNOSIS — I679 Cerebrovascular disease, unspecified: Secondary | ICD-10-CM | POA: Diagnosis not present

## 2018-04-12 DIAGNOSIS — R262 Difficulty in walking, not elsewhere classified: Secondary | ICD-10-CM | POA: Diagnosis not present

## 2018-04-12 DIAGNOSIS — D500 Iron deficiency anemia secondary to blood loss (chronic): Secondary | ICD-10-CM | POA: Diagnosis not present

## 2018-04-12 DIAGNOSIS — R293 Abnormal posture: Secondary | ICD-10-CM | POA: Diagnosis not present

## 2018-04-12 DIAGNOSIS — Z1212 Encounter for screening for malignant neoplasm of rectum: Secondary | ICD-10-CM | POA: Diagnosis not present

## 2018-04-14 DIAGNOSIS — R293 Abnormal posture: Secondary | ICD-10-CM | POA: Diagnosis not present

## 2018-04-14 DIAGNOSIS — M6281 Muscle weakness (generalized): Secondary | ICD-10-CM | POA: Diagnosis not present

## 2018-04-14 DIAGNOSIS — R262 Difficulty in walking, not elsewhere classified: Secondary | ICD-10-CM | POA: Diagnosis not present

## 2018-04-14 DIAGNOSIS — I679 Cerebrovascular disease, unspecified: Secondary | ICD-10-CM | POA: Diagnosis not present

## 2018-04-19 DIAGNOSIS — R293 Abnormal posture: Secondary | ICD-10-CM | POA: Diagnosis not present

## 2018-04-19 DIAGNOSIS — M6281 Muscle weakness (generalized): Secondary | ICD-10-CM | POA: Diagnosis not present

## 2018-04-19 DIAGNOSIS — I679 Cerebrovascular disease, unspecified: Secondary | ICD-10-CM | POA: Diagnosis not present

## 2018-04-19 DIAGNOSIS — R262 Difficulty in walking, not elsewhere classified: Secondary | ICD-10-CM | POA: Diagnosis not present

## 2018-04-20 DIAGNOSIS — I679 Cerebrovascular disease, unspecified: Secondary | ICD-10-CM | POA: Diagnosis not present

## 2018-04-20 DIAGNOSIS — R262 Difficulty in walking, not elsewhere classified: Secondary | ICD-10-CM | POA: Diagnosis not present

## 2018-04-20 DIAGNOSIS — M6281 Muscle weakness (generalized): Secondary | ICD-10-CM | POA: Diagnosis not present

## 2018-04-20 DIAGNOSIS — R293 Abnormal posture: Secondary | ICD-10-CM | POA: Diagnosis not present

## 2018-04-21 DIAGNOSIS — I679 Cerebrovascular disease, unspecified: Secondary | ICD-10-CM | POA: Diagnosis not present

## 2018-04-21 DIAGNOSIS — M6281 Muscle weakness (generalized): Secondary | ICD-10-CM | POA: Diagnosis not present

## 2018-04-21 DIAGNOSIS — R262 Difficulty in walking, not elsewhere classified: Secondary | ICD-10-CM | POA: Diagnosis not present

## 2018-04-21 DIAGNOSIS — R293 Abnormal posture: Secondary | ICD-10-CM | POA: Diagnosis not present

## 2018-04-26 DIAGNOSIS — I679 Cerebrovascular disease, unspecified: Secondary | ICD-10-CM | POA: Diagnosis not present

## 2018-04-26 DIAGNOSIS — R293 Abnormal posture: Secondary | ICD-10-CM | POA: Diagnosis not present

## 2018-04-26 DIAGNOSIS — M6281 Muscle weakness (generalized): Secondary | ICD-10-CM | POA: Diagnosis not present

## 2018-04-26 DIAGNOSIS — R262 Difficulty in walking, not elsewhere classified: Secondary | ICD-10-CM | POA: Diagnosis not present

## 2018-04-28 DIAGNOSIS — M6281 Muscle weakness (generalized): Secondary | ICD-10-CM | POA: Diagnosis not present

## 2018-04-28 DIAGNOSIS — R262 Difficulty in walking, not elsewhere classified: Secondary | ICD-10-CM | POA: Diagnosis not present

## 2018-04-28 DIAGNOSIS — R293 Abnormal posture: Secondary | ICD-10-CM | POA: Diagnosis not present

## 2018-04-28 DIAGNOSIS — I679 Cerebrovascular disease, unspecified: Secondary | ICD-10-CM | POA: Diagnosis not present

## 2018-05-02 DIAGNOSIS — R262 Difficulty in walking, not elsewhere classified: Secondary | ICD-10-CM | POA: Diagnosis not present

## 2018-05-02 DIAGNOSIS — R293 Abnormal posture: Secondary | ICD-10-CM | POA: Diagnosis not present

## 2018-05-02 DIAGNOSIS — I679 Cerebrovascular disease, unspecified: Secondary | ICD-10-CM | POA: Diagnosis not present

## 2018-05-02 DIAGNOSIS — M6281 Muscle weakness (generalized): Secondary | ICD-10-CM | POA: Diagnosis not present

## 2018-05-03 DIAGNOSIS — R293 Abnormal posture: Secondary | ICD-10-CM | POA: Diagnosis not present

## 2018-05-03 DIAGNOSIS — I679 Cerebrovascular disease, unspecified: Secondary | ICD-10-CM | POA: Diagnosis not present

## 2018-05-03 DIAGNOSIS — M6281 Muscle weakness (generalized): Secondary | ICD-10-CM | POA: Diagnosis not present

## 2018-05-03 DIAGNOSIS — R262 Difficulty in walking, not elsewhere classified: Secondary | ICD-10-CM | POA: Diagnosis not present

## 2018-05-05 DIAGNOSIS — M6281 Muscle weakness (generalized): Secondary | ICD-10-CM | POA: Diagnosis not present

## 2018-05-05 DIAGNOSIS — R293 Abnormal posture: Secondary | ICD-10-CM | POA: Diagnosis not present

## 2018-05-05 DIAGNOSIS — I679 Cerebrovascular disease, unspecified: Secondary | ICD-10-CM | POA: Diagnosis not present

## 2018-05-05 DIAGNOSIS — R262 Difficulty in walking, not elsewhere classified: Secondary | ICD-10-CM | POA: Diagnosis not present

## 2018-05-10 DIAGNOSIS — R293 Abnormal posture: Secondary | ICD-10-CM | POA: Diagnosis not present

## 2018-05-10 DIAGNOSIS — D500 Iron deficiency anemia secondary to blood loss (chronic): Secondary | ICD-10-CM | POA: Diagnosis not present

## 2018-05-10 DIAGNOSIS — I679 Cerebrovascular disease, unspecified: Secondary | ICD-10-CM | POA: Diagnosis not present

## 2018-05-10 DIAGNOSIS — R262 Difficulty in walking, not elsewhere classified: Secondary | ICD-10-CM | POA: Diagnosis not present

## 2018-05-10 DIAGNOSIS — M6281 Muscle weakness (generalized): Secondary | ICD-10-CM | POA: Diagnosis not present

## 2018-06-07 DIAGNOSIS — D500 Iron deficiency anemia secondary to blood loss (chronic): Secondary | ICD-10-CM | POA: Diagnosis not present

## 2018-06-20 DIAGNOSIS — Z23 Encounter for immunization: Secondary | ICD-10-CM | POA: Diagnosis not present

## 2018-06-20 DIAGNOSIS — E7800 Pure hypercholesterolemia, unspecified: Secondary | ICD-10-CM | POA: Diagnosis not present

## 2018-06-20 DIAGNOSIS — N183 Chronic kidney disease, stage 3 (moderate): Secondary | ICD-10-CM | POA: Diagnosis not present

## 2018-06-20 DIAGNOSIS — E119 Type 2 diabetes mellitus without complications: Secondary | ICD-10-CM | POA: Diagnosis not present

## 2018-06-20 DIAGNOSIS — D649 Anemia, unspecified: Secondary | ICD-10-CM | POA: Diagnosis not present

## 2018-07-04 DIAGNOSIS — D649 Anemia, unspecified: Secondary | ICD-10-CM | POA: Diagnosis not present

## 2018-07-04 DIAGNOSIS — N189 Chronic kidney disease, unspecified: Secondary | ICD-10-CM | POA: Diagnosis not present

## 2018-07-05 DIAGNOSIS — D500 Iron deficiency anemia secondary to blood loss (chronic): Secondary | ICD-10-CM | POA: Diagnosis not present

## 2018-07-05 DIAGNOSIS — I420 Dilated cardiomyopathy: Secondary | ICD-10-CM | POA: Diagnosis not present

## 2018-07-05 DIAGNOSIS — I5022 Chronic systolic (congestive) heart failure: Secondary | ICD-10-CM | POA: Diagnosis not present

## 2018-07-05 DIAGNOSIS — I2510 Atherosclerotic heart disease of native coronary artery without angina pectoris: Secondary | ICD-10-CM | POA: Diagnosis not present

## 2018-07-05 DIAGNOSIS — I110 Hypertensive heart disease with heart failure: Secondary | ICD-10-CM | POA: Diagnosis not present

## 2018-07-05 DIAGNOSIS — I493 Ventricular premature depolarization: Secondary | ICD-10-CM | POA: Diagnosis not present

## 2018-08-02 DIAGNOSIS — D500 Iron deficiency anemia secondary to blood loss (chronic): Secondary | ICD-10-CM | POA: Diagnosis not present

## 2018-08-10 DIAGNOSIS — J209 Acute bronchitis, unspecified: Secondary | ICD-10-CM | POA: Diagnosis not present

## 2018-08-10 DIAGNOSIS — R0600 Dyspnea, unspecified: Secondary | ICD-10-CM | POA: Diagnosis not present

## 2018-08-10 DIAGNOSIS — R0602 Shortness of breath: Secondary | ICD-10-CM | POA: Diagnosis not present

## 2018-08-10 DIAGNOSIS — J90 Pleural effusion, not elsewhere classified: Secondary | ICD-10-CM | POA: Diagnosis not present

## 2018-08-10 DIAGNOSIS — J9811 Atelectasis: Secondary | ICD-10-CM | POA: Diagnosis not present

## 2018-08-10 DIAGNOSIS — I700 Atherosclerosis of aorta: Secondary | ICD-10-CM | POA: Diagnosis not present

## 2018-08-10 DIAGNOSIS — R062 Wheezing: Secondary | ICD-10-CM | POA: Diagnosis not present

## 2018-08-10 DIAGNOSIS — D649 Anemia, unspecified: Secondary | ICD-10-CM | POA: Diagnosis not present

## 2018-08-13 DIAGNOSIS — E872 Acidosis: Secondary | ICD-10-CM | POA: Diagnosis not present

## 2018-08-13 DIAGNOSIS — I447 Left bundle-branch block, unspecified: Secondary | ICD-10-CM | POA: Diagnosis not present

## 2018-08-13 DIAGNOSIS — E119 Type 2 diabetes mellitus without complications: Secondary | ICD-10-CM | POA: Diagnosis not present

## 2018-08-13 DIAGNOSIS — I214 Non-ST elevation (NSTEMI) myocardial infarction: Secondary | ICD-10-CM | POA: Diagnosis not present

## 2018-08-13 DIAGNOSIS — I110 Hypertensive heart disease with heart failure: Secondary | ICD-10-CM | POA: Diagnosis not present

## 2018-08-13 DIAGNOSIS — I959 Hypotension, unspecified: Secondary | ICD-10-CM | POA: Diagnosis not present

## 2018-08-13 DIAGNOSIS — I2510 Atherosclerotic heart disease of native coronary artery without angina pectoris: Secondary | ICD-10-CM | POA: Diagnosis not present

## 2018-08-13 DIAGNOSIS — D649 Anemia, unspecified: Secondary | ICD-10-CM | POA: Diagnosis not present

## 2018-08-13 DIAGNOSIS — J80 Acute respiratory distress syndrome: Secondary | ICD-10-CM | POA: Diagnosis not present

## 2018-08-13 DIAGNOSIS — R6521 Severe sepsis with septic shock: Secondary | ICD-10-CM | POA: Diagnosis not present

## 2018-08-13 DIAGNOSIS — Z7982 Long term (current) use of aspirin: Secondary | ICD-10-CM | POA: Diagnosis not present

## 2018-08-13 DIAGNOSIS — R062 Wheezing: Secondary | ICD-10-CM | POA: Diagnosis not present

## 2018-08-13 DIAGNOSIS — Z885 Allergy status to narcotic agent status: Secondary | ICD-10-CM | POA: Diagnosis not present

## 2018-08-13 DIAGNOSIS — R7989 Other specified abnormal findings of blood chemistry: Secondary | ICD-10-CM | POA: Diagnosis not present

## 2018-08-13 DIAGNOSIS — I252 Old myocardial infarction: Secondary | ICD-10-CM | POA: Diagnosis not present

## 2018-08-13 DIAGNOSIS — Z8673 Personal history of transient ischemic attack (TIA), and cerebral infarction without residual deficits: Secondary | ICD-10-CM | POA: Diagnosis not present

## 2018-08-13 DIAGNOSIS — R0602 Shortness of breath: Secondary | ICD-10-CM | POA: Diagnosis not present

## 2018-08-13 DIAGNOSIS — I5023 Acute on chronic systolic (congestive) heart failure: Secondary | ICD-10-CM | POA: Diagnosis not present

## 2018-08-13 DIAGNOSIS — J9601 Acute respiratory failure with hypoxia: Secondary | ICD-10-CM | POA: Diagnosis not present

## 2018-08-13 DIAGNOSIS — Z79899 Other long term (current) drug therapy: Secondary | ICD-10-CM | POA: Diagnosis not present

## 2018-08-13 DIAGNOSIS — E785 Hyperlipidemia, unspecified: Secondary | ICD-10-CM | POA: Diagnosis not present

## 2018-08-13 DIAGNOSIS — N179 Acute kidney failure, unspecified: Secondary | ICD-10-CM | POA: Diagnosis not present

## 2018-08-13 DIAGNOSIS — I4891 Unspecified atrial fibrillation: Secondary | ICD-10-CM | POA: Diagnosis not present

## 2018-08-13 DIAGNOSIS — R000 Tachycardia, unspecified: Secondary | ICD-10-CM | POA: Diagnosis not present

## 2018-08-13 DIAGNOSIS — R0689 Other abnormalities of breathing: Secondary | ICD-10-CM | POA: Diagnosis not present

## 2018-08-13 DIAGNOSIS — I509 Heart failure, unspecified: Secondary | ICD-10-CM | POA: Diagnosis not present

## 2018-08-13 DIAGNOSIS — A419 Sepsis, unspecified organism: Secondary | ICD-10-CM | POA: Diagnosis not present

## 2018-08-13 DIAGNOSIS — I480 Paroxysmal atrial fibrillation: Secondary | ICD-10-CM | POA: Diagnosis not present

## 2018-08-13 DIAGNOSIS — Z23 Encounter for immunization: Secondary | ICD-10-CM | POA: Diagnosis not present

## 2018-08-15 ENCOUNTER — Observation Stay (HOSPITAL_COMMUNITY): Payer: Medicare Other

## 2018-08-15 ENCOUNTER — Inpatient Hospital Stay (HOSPITAL_COMMUNITY)
Admission: AD | Admit: 2018-08-15 | Discharge: 2018-08-22 | DRG: 287 | Disposition: A | Discharge status: Skilled Nursing Facility | Payer: Medicare Other | Source: Other Acute Inpatient Hospital | Attending: Cardiology | Admitting: Cardiology

## 2018-08-15 DIAGNOSIS — F0390 Unspecified dementia without behavioral disturbance: Secondary | ICD-10-CM | POA: Diagnosis not present

## 2018-08-15 DIAGNOSIS — E872 Acidosis: Secondary | ICD-10-CM | POA: Diagnosis not present

## 2018-08-15 DIAGNOSIS — D631 Anemia in chronic kidney disease: Secondary | ICD-10-CM | POA: Diagnosis not present

## 2018-08-15 DIAGNOSIS — Z955 Presence of coronary angioplasty implant and graft: Secondary | ICD-10-CM | POA: Diagnosis not present

## 2018-08-15 DIAGNOSIS — Z885 Allergy status to narcotic agent status: Secondary | ICD-10-CM | POA: Diagnosis not present

## 2018-08-15 DIAGNOSIS — D649 Anemia, unspecified: Secondary | ICD-10-CM | POA: Diagnosis not present

## 2018-08-15 DIAGNOSIS — I25118 Atherosclerotic heart disease of native coronary artery with other forms of angina pectoris: Secondary | ICD-10-CM | POA: Diagnosis not present

## 2018-08-15 DIAGNOSIS — Z8673 Personal history of transient ischemic attack (TIA), and cerebral infarction without residual deficits: Secondary | ICD-10-CM | POA: Diagnosis not present

## 2018-08-15 DIAGNOSIS — K219 Gastro-esophageal reflux disease without esophagitis: Secondary | ICD-10-CM | POA: Diagnosis not present

## 2018-08-15 DIAGNOSIS — I509 Heart failure, unspecified: Secondary | ICD-10-CM | POA: Diagnosis not present

## 2018-08-15 DIAGNOSIS — I5043 Acute on chronic combined systolic (congestive) and diastolic (congestive) heart failure: Secondary | ICD-10-CM | POA: Diagnosis not present

## 2018-08-15 DIAGNOSIS — I25119 Atherosclerotic heart disease of native coronary artery with unspecified angina pectoris: Secondary | ICD-10-CM | POA: Diagnosis present

## 2018-08-15 DIAGNOSIS — N179 Acute kidney failure, unspecified: Secondary | ICD-10-CM | POA: Diagnosis not present

## 2018-08-15 DIAGNOSIS — I5023 Acute on chronic systolic (congestive) heart failure: Secondary | ICD-10-CM | POA: Diagnosis not present

## 2018-08-15 DIAGNOSIS — R0602 Shortness of breath: Secondary | ICD-10-CM

## 2018-08-15 DIAGNOSIS — I4891 Unspecified atrial fibrillation: Secondary | ICD-10-CM | POA: Diagnosis present

## 2018-08-15 DIAGNOSIS — E119 Type 2 diabetes mellitus without complications: Secondary | ICD-10-CM | POA: Diagnosis not present

## 2018-08-15 DIAGNOSIS — Z66 Do not resuscitate: Secondary | ICD-10-CM | POA: Diagnosis present

## 2018-08-15 DIAGNOSIS — I959 Hypotension, unspecified: Secondary | ICD-10-CM | POA: Diagnosis not present

## 2018-08-15 DIAGNOSIS — I480 Paroxysmal atrial fibrillation: Secondary | ICD-10-CM | POA: Diagnosis not present

## 2018-08-15 DIAGNOSIS — C61 Malignant neoplasm of prostate: Secondary | ICD-10-CM | POA: Diagnosis present

## 2018-08-15 DIAGNOSIS — I110 Hypertensive heart disease with heart failure: Secondary | ICD-10-CM | POA: Diagnosis not present

## 2018-08-15 DIAGNOSIS — E876 Hypokalemia: Secondary | ICD-10-CM | POA: Diagnosis not present

## 2018-08-15 DIAGNOSIS — I428 Other cardiomyopathies: Secondary | ICD-10-CM | POA: Diagnosis not present

## 2018-08-15 HISTORY — DX: Type 2 diabetes mellitus without complications: E11.9

## 2018-08-15 HISTORY — DX: Cerebral infarction, unspecified: I63.9

## 2018-08-15 HISTORY — DX: Unspecified dementia, unspecified severity, without behavioral disturbance, psychotic disturbance, mood disturbance, and anxiety: F03.90

## 2018-08-15 HISTORY — DX: Adverse effect of unspecified anesthetic, initial encounter: T41.45XA

## 2018-08-15 HISTORY — DX: Heart failure, unspecified: I50.9

## 2018-08-15 HISTORY — DX: Unspecified atrial fibrillation: I48.91

## 2018-08-15 HISTORY — DX: Other complications of anesthesia, initial encounter: T88.59XA

## 2018-08-15 HISTORY — DX: Dyspnea, unspecified: R06.00

## 2018-08-15 LAB — CBC
HCT: 28.3 % — ABNORMAL LOW (ref 39.0–52.0)
Hemoglobin: 9.1 g/dL — ABNORMAL LOW (ref 13.0–17.0)
MCH: 30.8 pg (ref 26.0–34.0)
MCHC: 32.2 g/dL (ref 30.0–36.0)
MCV: 95.9 fL (ref 80.0–100.0)
Platelets: 285 10*3/uL (ref 150–400)
RBC: 2.95 MIL/uL — ABNORMAL LOW (ref 4.22–5.81)
RDW: 14.5 % (ref 11.5–15.5)
WBC: 8.4 10*3/uL (ref 4.0–10.5)
nRBC: 0 % (ref 0.0–0.2)

## 2018-08-15 LAB — HEPARIN LEVEL (UNFRACTIONATED): Heparin Unfractionated: 0.57 IU/mL (ref 0.30–0.70)

## 2018-08-15 LAB — PROTIME-INR
INR: 1.16
Prothrombin Time: 14.7 seconds (ref 11.4–15.2)

## 2018-08-15 LAB — GLUCOSE, CAPILLARY: GLUCOSE-CAPILLARY: 261 mg/dL — AB (ref 70–99)

## 2018-08-15 MED ORDER — ONDANSETRON HCL 4 MG/2ML IJ SOLN: 4.0000 mg | Freq: Four times a day (QID) | INTRAMUSCULAR | Status: DC | PRN

## 2018-08-15 MED ORDER — CLOPIDOGREL BISULFATE 75 MG PO TABS
75.0000 mg | ORAL_TABLET | Freq: Every day | ORAL | Status: DC
  Administered 2018-08-16 – 2018-08-22 (×7): 75 mg via ORAL
  Filled 2018-08-15 (×7): qty 1

## 2018-08-15 MED ORDER — ROSUVASTATIN CALCIUM 5 MG PO TABS
10.0000 mg | ORAL_TABLET | Freq: Every day | ORAL | Status: DC
  Administered 2018-08-16 – 2018-08-22 (×7): 10 mg via ORAL
  Filled 2018-08-15 (×8): qty 2

## 2018-08-15 MED ORDER — FUROSEMIDE 10 MG/ML IJ SOLN
80.0000 mg | Freq: Two times a day (BID) | INTRAMUSCULAR | Status: DC
  Administered 2018-08-16 – 2018-08-17 (×4): 80 mg via INTRAVENOUS
  Filled 2018-08-15 (×4): qty 8

## 2018-08-15 MED ORDER — INSULIN GLARGINE 100 UNIT/ML ~~LOC~~ SOLN
10.0000 [IU] | Freq: Every day | SUBCUTANEOUS | Status: DC
  Administered 2018-08-15 – 2018-08-22 (×8): 10 [IU] via SUBCUTANEOUS
  Filled 2018-08-15 (×8): qty 0.1

## 2018-08-15 MED ORDER — PANTOPRAZOLE SODIUM 40 MG PO TBEC
40.0000 mg | DELAYED_RELEASE_TABLET | Freq: Every day | ORAL | Status: DC
  Administered 2018-08-16 – 2018-08-22 (×7): 40 mg via ORAL
  Filled 2018-08-15 (×7): qty 1

## 2018-08-15 MED ORDER — ACETAMINOPHEN 325 MG PO TABS
650.0000 mg | ORAL_TABLET | ORAL | Status: DC | PRN
  Administered 2018-08-21: 650 mg via ORAL
  Filled 2018-08-15: qty 2

## 2018-08-15 MED ORDER — SACUBITRIL-VALSARTAN 24-26 MG PO TABS
1.0000 | ORAL_TABLET | Freq: Two times a day (BID) | ORAL | Status: DC
  Administered 2018-08-15 – 2018-08-17 (×4): 1 via ORAL
  Filled 2018-08-15 (×8): qty 1

## 2018-08-15 MED ORDER — DONEPEZIL HCL 10 MG PO TABS
10.0000 mg | ORAL_TABLET | Freq: Every day | ORAL | Status: DC
  Administered 2018-08-15 – 2018-08-22 (×8): 10 mg via ORAL
  Filled 2018-08-15 (×8): qty 1

## 2018-08-15 MED ORDER — NITROGLYCERIN 0.4 MG SL SUBL: 0.4000 mg | SUBLINGUAL_TABLET | SUBLINGUAL | Status: DC | PRN

## 2018-08-15 MED ORDER — DILTIAZEM HCL-DEXTROSE 100-5 MG/100ML-% IV SOLN (PREMIX)
5.0000 mg/h | INTRAVENOUS | Status: DC
  Filled 2018-08-15: qty 100

## 2018-08-15 MED ORDER — INSULIN ASPART 100 UNIT/ML ~~LOC~~ SOLN
0.0000 [IU] | Freq: Every day | SUBCUTANEOUS | Status: DC
  Administered 2018-08-15 – 2018-08-16 (×2): 3 [IU] via SUBCUTANEOUS

## 2018-08-15 MED ORDER — INSULIN ASPART 100 UNIT/ML ~~LOC~~ SOLN
0.0000 [IU] | Freq: Three times a day (TID) | SUBCUTANEOUS | Status: DC
  Administered 2018-08-16 – 2018-08-17 (×4): 2 [IU] via SUBCUTANEOUS
  Administered 2018-08-17: 1 [IU] via SUBCUTANEOUS
  Administered 2018-08-17 – 2018-08-18 (×3): 2 [IU] via SUBCUTANEOUS
  Administered 2018-08-18: 3 [IU] via SUBCUTANEOUS
  Administered 2018-08-19 (×2): 1 [IU] via SUBCUTANEOUS
  Administered 2018-08-19 – 2018-08-20 (×3): 2 [IU] via SUBCUTANEOUS
  Administered 2018-08-21 (×2): 1 [IU] via SUBCUTANEOUS
  Administered 2018-08-21 – 2018-08-22 (×2): 2 [IU] via SUBCUTANEOUS
  Administered 2018-08-22 (×2): 1 [IU] via SUBCUTANEOUS

## 2018-08-15 MED ORDER — HEPARIN (PORCINE) 25000 UT/250ML-% IV SOLN
1000.0000 [IU]/h | INTRAVENOUS | Status: DC
  Administered 2018-08-16: 1000 [IU]/h via INTRAVENOUS
  Filled 2018-08-15: qty 250

## 2018-08-15 MED ORDER — ASPIRIN EC 81 MG PO TBEC
81.0000 mg | DELAYED_RELEASE_TABLET | Freq: Every day | ORAL | Status: DC
  Administered 2018-08-16 – 2018-08-22 (×6): 81 mg via ORAL
  Filled 2018-08-15 (×6): qty 1

## 2018-08-15 NOTE — H&P (Addendum)
Cardiology History & Physical    Patient ID: Drew Weber MRN: [id number], DOB: 1935/09/03 Date of Encounter: 08/15/2018, 10:36 PM Primary Physician: Patient, No Pcp Per  Chief Complaint: Shortness of breath   HPI: Drew Weber is a 83 y.o. male with history of ischemic cardiomyopathy (EF has been less than 35% for a number of years, he has refused ICD placement), diabetes on oral agents, paroxysmal atrial fibrillation, prior stroke, hypertension, and dementia, who presents as a transfer from [place] for further management of acute decompensated heart failure.  The patient initially presented to his PCPs office on 11/20, complaining of shortness of breath and weakness for about a week.  He was started on p.o. Levaquin, with minimal improvement.  He ultimately presented to the [facility] ED on 11/23.  There, his initial labs were notable for a lactate of 5.4, creatinine of 1.3, and a hemoglobin of approximately 8.  Due to concern for underlying sepsis, he was given vigorous IV fluids, but subsequently developed hypoxemic respiratory failure requiring BiPAP.  He was also noted to be in atrial fibrillation with RVR.  He was started on IV heparin and IV diltiazem.  As his infectious work-up was ultimately negative, they began aggressive IV diuresis, with rapid improvement of oxygenation.  He was quickly weaned back to nasal cannula.  He had an echocardiogram on 11/24 that showed severely depressed ejection fraction of less than 15%.  His troponins were mildly elevated at [place], as high as 3.5.  Per the patient, he has had no chest pain at any point preceding or during this hospitalization.  He was ultimately transferred to [facility] for further management and consideration of cardiac catheterization.  Upon my interview, the patient denies any acute complaints.  His shortness of breath is much improved.  PMH: Coronary artery disease status post LAD and diagonal PCI in 2009 Paroxysmal atrial  fibrillation Chronic systolic heart failure Hypertension Paroxysmal atrial fibrillation Diabetes Prostate cancer on leuprolide Dementia  Home Meds: Aspirin 81 mg daily Crestor 20 mg daily Aricept 10 mg daily Ferrous sulfate 325 mg daily Metformin thousand milligrams twice daily Metoprolol succinate 100 mg daily Pantoprazole 40 mg daily Entresto 24-26 1 tablet twice daily  Allergies: Allergies not on file  Social History   Socioeconomic History  . Marital status: Married    Spouse name: Not on file  . Number of children: Not on file  . Years of education: Not on file  . Highest education level: Not on file  Occupational History  . Not on file  Social Needs  . Financial resource strain: Not on file  . Food insecurity:    Worry: Not on file    Inability: Not on file  . Transportation needs:    Medical: Not on file    Non-medical: Not on file  Tobacco Use  . Smoking status: Not on file  Substance and Sexual Activity  . Alcohol use: Not on file  . Drug use: Not on file  . Sexual activity: Not on file  Lifestyle  . Physical activity:    Days per week: Not on file    Minutes per session: Not on file  . Stress: Not on file  Relationships  . Social connections:    Talks on phone: Not on file    Gets together: Not on file    Attends religious service: Not on file    Active member of club or organization: Not on file    Attends meetings  of clubs or organizations: Not on file    Relationship status: Not on file  . Intimate partner violence:    Fear of current or ex partner: Not on file    Emotionally abused: Not on file    Physically abused: Not on file    Forced sexual activity: Not on file  Other Topics Concern  . Not on file  Social History Narrative  . Not on file     No family history on file.  Review of Systems: All other systems reviewed and are otherwise negative except as noted above.  Labs:  No results found for: WBC, HGB, HCT, MCV, PLT No  results for input(s): NA, K, CL, CO2, BUN, CREATININE, CALCIUM, PROT, BILITOT, ALKPHOS, ALT, AST, GLUCOSE in the last 168 hours.  Invalid input(s): LABALBU No results for input(s): CKTOTAL, CKMB, TROPONINI in the last 72 hours. No results found for: CHOL, HDL, LDLCALC, TRIG No results found for: DDIMER  Radiology/Studies:  No results found. Wt Readings from Last 3 Encounters:  08/15/18 67.2 kg    EKG: Ordered and pending  Physical Exam: Blood pressure (!) 123/59, pulse (!) 117, temperature 99.5 F (37.5 C), temperature source Axillary, resp. rate (!) 21, height 5\' 6"  (1.676 m), weight 67.2 kg, SpO2 97 %. Body mass index is 23.9 kg/m. General: Well developed, well nourished, in no acute distress. Head: Normocephalic, atraumatic, sclera non-icteric, no xanthomas, nares are without discharge.  Neck: Negative for carotid bruits. JVD significantly elevated Lungs: Occasional inspiratory crackles.  Breathing is unlabored. Heart: Irregularly irregular with S1 S2. No murmurs, rubs, or gallops appreciated. Abdomen: Soft, non-tender, non-distended with normoactive bowel sounds. No hepatomegaly. No rebound/guarding. No obvious abdominal masses. Msk:  Strength and tone appear normal for age. Extremities: No clubbing or cyanosis. No edema.  Distal pedal pulses are 2+ and equal bilaterally. Neuro: Alert and oriented X 3. No focal deficit. No facial asymmetry. Moves all extremities spontaneously. Psych:  Responds to questions appropriately with a normal affect.    Assessment and Plan   83 year old man with chronic systolic heart failure the setting of coronary artery disease who presents with acute decompensated heart failure.  1.  Acute on chronic systolic heart failure: Still appears volume up on exam.  Plan to continue IV Lasix 80 mg twice daily.  Patient with chronically depressed ejection fraction, has refused ICD consistently over the past several years.  Could consider ischemic evaluation  at some point down the road, but would optimize with further diuresis for the time being.  2.  Paroxysmal atrial fibrillation:Appears to be in sinus rhythm with frequent PVCs at present; patient arrived on diltiazem drip.  Given his low EF and what appears to be sinus, I will discontinue diltiazem.  Would consider adding IV amiodarone if he has rapid AF going forward.  Continue on IV heparin for now, would transition to NOAC for chronic anticoagulation.  3.  CAD: Consideration of ischemic work-up as above.  Continue home aspirin and Plavix for now.  IV heparin as aforementioned.  4.  Diabetes: Holding home metformin, continuing low-dose Lantus and aspart insulin sliding scale.  5.  Anemia: We will check iron studies, TSH, B12, folate.  Would replete with IV iron in-house if iron deficient.  6. GERD: Continue home PPI.  7.  CODE STATUS: DNR/DNI.  Discussed and confirmed with patient   Signed, Doylene Canning, MD 08/15/2018, 10:36 PM

## 2018-08-15 NOTE — Progress Notes (Signed)
ANTICOAGULATION CONSULT NOTE - Initial Consult  Pharmacy Consult for Heparin Indication: chest pain/ACS, afib  Allergies not on file  Patient Measurements: Height: 5\' 6"  (167.6 cm) Weight: 148 lb 1.6 oz (67.2 kg) IBW/kg (Calculated) : 63.8 Heparin Dosing Weight: 67 kg  Vital Signs: Temp: 99.5 F (37.5 C) (11/25 2136) Temp Source: Axillary (11/25 2136) BP: 123/59 (11/25 2136) Pulse Rate: 117 (11/25 2136)  Labs: No results for input(s): HGB, HCT, PLT, APTT, LABPROT, INR, HEPARINUNFRC, HEPRLOWMOCWT, CREATININE, CKTOTAL, CKMB, TROPONINI in the last 72 hours.  CrCl cannot be calculated (No successful lab value found.).   Medical History: No past medical history on file.  Medications:  Med rec pending  Assessment: 83 y.o. M transferred from [facility] for NSTEMI w/u. Pt has been on heparin gtt since 11/23 p.m. - therapeutic PTTs on 1000 units/hr for NSTEMI and afib.  Labs at [facility] today: Hgb 7.9, Hct 23.2, plt 211  Goal of Therapy:  Heparin level 0.3-0.7 units/ml Monitor platelets by anticoagulation protocol: Yes   Plan:  Continue heparin at 1000 units/hr Stat heparin level Daily heparin level and CBC  Sherlon Handing, PharmD, BCPS Clinical pharmacist  **Pharmacist phone directory can now be found on [email or web address] ([email or web address]).  Listed under [place]. 08/15/2018,10:10 PM

## 2018-08-16 DIAGNOSIS — R7989 Other specified abnormal findings of blood chemistry: Secondary | ICD-10-CM | POA: Diagnosis not present

## 2018-08-16 DIAGNOSIS — Z66 Do not resuscitate: Secondary | ICD-10-CM | POA: Diagnosis present

## 2018-08-16 DIAGNOSIS — C61 Malignant neoplasm of prostate: Secondary | ICD-10-CM | POA: Diagnosis present

## 2018-08-16 DIAGNOSIS — I25119 Atherosclerotic heart disease of native coronary artery with unspecified angina pectoris: Secondary | ICD-10-CM | POA: Diagnosis not present

## 2018-08-16 DIAGNOSIS — J9691 Respiratory failure, unspecified with hypoxia: Secondary | ICD-10-CM | POA: Diagnosis not present

## 2018-08-16 DIAGNOSIS — I428 Other cardiomyopathies: Secondary | ICD-10-CM | POA: Diagnosis not present

## 2018-08-16 DIAGNOSIS — I25118 Atherosclerotic heart disease of native coronary artery with other forms of angina pectoris: Secondary | ICD-10-CM

## 2018-08-16 DIAGNOSIS — D649 Anemia, unspecified: Secondary | ICD-10-CM | POA: Diagnosis not present

## 2018-08-16 DIAGNOSIS — R0602 Shortness of breath: Secondary | ICD-10-CM | POA: Diagnosis present

## 2018-08-16 DIAGNOSIS — I5043 Acute on chronic combined systolic (congestive) and diastolic (congestive) heart failure: Secondary | ICD-10-CM | POA: Diagnosis present

## 2018-08-16 DIAGNOSIS — Z8673 Personal history of transient ischemic attack (TIA), and cerebral infarction without residual deficits: Secondary | ICD-10-CM | POA: Diagnosis not present

## 2018-08-16 DIAGNOSIS — E876 Hypokalemia: Secondary | ICD-10-CM | POA: Diagnosis present

## 2018-08-16 DIAGNOSIS — E119 Type 2 diabetes mellitus without complications: Secondary | ICD-10-CM | POA: Diagnosis not present

## 2018-08-16 DIAGNOSIS — D631 Anemia in chronic kidney disease: Secondary | ICD-10-CM | POA: Diagnosis present

## 2018-08-16 DIAGNOSIS — I5023 Acute on chronic systolic (congestive) heart failure: Secondary | ICD-10-CM | POA: Diagnosis not present

## 2018-08-16 DIAGNOSIS — I10 Essential (primary) hypertension: Secondary | ICD-10-CM | POA: Diagnosis not present

## 2018-08-16 DIAGNOSIS — K219 Gastro-esophageal reflux disease without esophagitis: Secondary | ICD-10-CM | POA: Diagnosis not present

## 2018-08-16 DIAGNOSIS — I480 Paroxysmal atrial fibrillation: Secondary | ICD-10-CM | POA: Diagnosis not present

## 2018-08-16 DIAGNOSIS — I249 Acute ischemic heart disease, unspecified: Secondary | ICD-10-CM | POA: Diagnosis not present

## 2018-08-16 DIAGNOSIS — Z955 Presence of coronary angioplasty implant and graft: Secondary | ICD-10-CM | POA: Diagnosis not present

## 2018-08-16 DIAGNOSIS — Z885 Allergy status to narcotic agent status: Secondary | ICD-10-CM | POA: Diagnosis not present

## 2018-08-16 DIAGNOSIS — A419 Sepsis, unspecified organism: Secondary | ICD-10-CM | POA: Diagnosis not present

## 2018-08-16 DIAGNOSIS — I110 Hypertensive heart disease with heart failure: Secondary | ICD-10-CM | POA: Diagnosis present

## 2018-08-16 DIAGNOSIS — I639 Cerebral infarction, unspecified: Secondary | ICD-10-CM | POA: Diagnosis not present

## 2018-08-16 DIAGNOSIS — F0390 Unspecified dementia without behavioral disturbance: Secondary | ICD-10-CM | POA: Diagnosis present

## 2018-08-16 DIAGNOSIS — J449 Chronic obstructive pulmonary disease, unspecified: Secondary | ICD-10-CM | POA: Diagnosis not present

## 2018-08-16 DIAGNOSIS — E785 Hyperlipidemia, unspecified: Secondary | ICD-10-CM | POA: Diagnosis not present

## 2018-08-16 DIAGNOSIS — I959 Hypotension, unspecified: Secondary | ICD-10-CM | POA: Diagnosis not present

## 2018-08-16 LAB — GLUCOSE, CAPILLARY
GLUCOSE-CAPILLARY: 165 mg/dL — AB (ref 70–99)
GLUCOSE-CAPILLARY: 190 mg/dL — AB (ref 70–99)
GLUCOSE-CAPILLARY: 269 mg/dL — AB (ref 70–99)
Glucose-Capillary: 181 mg/dL — ABNORMAL HIGH (ref 70–99)
Glucose-Capillary: 209 mg/dL — ABNORMAL HIGH (ref 70–99)

## 2018-08-16 LAB — BASIC METABOLIC PANEL
Anion gap: 8 (ref 5–15)
Anion gap: 10 (ref 5–15)
BUN: 25 mg/dL — ABNORMAL HIGH (ref 8–23)
BUN: 24 mg/dL — ABNORMAL HIGH (ref 8–23)
CO2: 27 mmol/L (ref 22–32)
CO2: 27 mmol/L (ref 22–32)
CREATININE: 1.24 mg/dL (ref 0.61–1.24)
Calcium: 8.3 mg/dL — ABNORMAL LOW (ref 8.9–10.3)
Calcium: 8.4 mg/dL — ABNORMAL LOW (ref 8.9–10.3)
Chloride: 104 mmol/L (ref 98–111)
Chloride: 102 mmol/L (ref 98–111)
Creatinine, Ser: 1.23 mg/dL (ref 0.61–1.24)
GFR calc Af Amer: >60 mL/min (ref >60)
GFR calc non Af Amer: 53 mL/min — ABNORMAL LOW (ref >60)
GFR calc non Af Amer: 54 mL/min — ABNORMAL LOW (ref >60)
Glucose, Bld: 263 mg/dL — ABNORMAL HIGH (ref 70–99)
Glucose, Bld: 208 mg/dL — ABNORMAL HIGH (ref 70–99)
Potassium: 2.7 mmol/L — CL (ref 3.5–5.1)
Potassium: 4 mmol/L (ref 3.5–5.1)
Sodium: 139 mmol/L (ref 135–145)
Sodium: 139 mmol/L (ref 135–145)

## 2018-08-16 LAB — TROPONIN I: Troponin I: 0.77 ng/mL (ref <0.03)

## 2018-08-16 LAB — IRON AND TIBC
Iron: 21 ug/dL — ABNORMAL LOW (ref 45–182)
Saturation Ratios: 6 % — ABNORMAL LOW (ref 17.9–39.5)
TIBC: 358 ug/dL (ref 250–450)
UIBC: 337 ug/dL

## 2018-08-16 LAB — POTASSIUM: Potassium: 3.2 mmol/L — ABNORMAL LOW (ref 3.5–5.1)

## 2018-08-16 LAB — CBC
HEMATOCRIT: 27.8 % — AB (ref 39.0–52.0)
Hemoglobin: 8.9 g/dL — ABNORMAL LOW (ref 13.0–17.0)
MCH: 31.1 pg (ref 26.0–34.0)
MCHC: 32 g/dL (ref 30.0–36.0)
MCV: 97.2 fL (ref 80.0–100.0)
NRBC: 0 % (ref 0.0–0.2)
Platelets: 249 10*3/uL (ref 150–400)
RBC: 2.86 MIL/uL — ABNORMAL LOW (ref 4.22–5.81)
RDW: 14.6 % (ref 11.5–15.5)
WBC: 8.8 10*3/uL (ref 4.0–10.5)

## 2018-08-16 LAB — FERRITIN: Ferritin: 34 ng/mL (ref 24–336)

## 2018-08-16 LAB — MRSA PCR SCREENING: MRSA BY PCR: NEGATIVE

## 2018-08-16 LAB — HEPARIN LEVEL (UNFRACTIONATED): Heparin Unfractionated: 0.39 IU/mL (ref 0.30–0.70)

## 2018-08-16 LAB — VITAMIN B12: Vitamin B-12: 688 pg/mL (ref 180–914)

## 2018-08-16 MED ORDER — POTASSIUM CHLORIDE CRYS ER 20 MEQ PO TBCR
40.0000 meq | EXTENDED_RELEASE_TABLET | ORAL | Status: AC
  Administered 2018-08-16 (×2): 40 meq via ORAL
  Filled 2018-08-16 (×2): qty 2

## 2018-08-16 MED ORDER — SODIUM CHLORIDE 0.9 % IV SOLN: 250.0000 mL | INTRAVENOUS | Status: DC | PRN

## 2018-08-16 MED ORDER — POTASSIUM CHLORIDE CRYS ER 20 MEQ PO TBCR
20.0000 meq | EXTENDED_RELEASE_TABLET | Freq: Two times a day (BID) | ORAL | Status: DC
  Administered 2018-08-16 – 2018-08-20 (×9): 20 meq via ORAL
  Filled 2018-08-16 (×9): qty 1

## 2018-08-16 MED ORDER — SODIUM CHLORIDE 0.9 % WEIGHT BASED INFUSION: 1.0000 mL/kg/h | INTRAVENOUS | Status: DC

## 2018-08-16 MED ORDER — ASPIRIN 81 MG PO CHEW
81.0000 mg | CHEWABLE_TABLET | ORAL | Status: AC
  Administered 2018-08-17: 81 mg via ORAL
  Filled 2018-08-16: qty 1

## 2018-08-16 MED ORDER — SODIUM CHLORIDE 0.9 % WEIGHT BASED INFUSION: 3.0000 mL/kg/h | INTRAVENOUS | Status: DC

## 2018-08-16 MED ORDER — SODIUM CHLORIDE 0.9% FLUSH: 3.0000 mL | Freq: Two times a day (BID) | INTRAVENOUS | Status: DC

## 2018-08-16 MED ORDER — ORAL CARE MOUTH RINSE
15.0000 mL | Freq: Two times a day (BID) | OROMUCOSAL | Status: DC
  Administered 2018-08-17 – 2018-08-22 (×8): 15 mL via OROMUCOSAL

## 2018-08-16 MED ORDER — SODIUM CHLORIDE 0.9% FLUSH: 3.0000 mL | INTRAVENOUS | Status: DC | PRN

## 2018-08-16 NOTE — Progress Notes (Signed)
ANTICOAGULATION CONSULT NOTE - Initial Consult  Pharmacy Consult for Heparin Indication: chest pain/ACS, afib  Allergies not on file  Patient Measurements: Height: 5\' 6"  (167.6 cm) Weight: 141 lb 3.2 oz (64 kg) IBW/kg (Calculated) : 63.8 Heparin Dosing Weight: 67 kg  Vital Signs: Temp: 97.9 F (36.6 C) (11/26 0738) Temp Source: Oral (11/26 0738) BP: 93/68 (11/26 0738) Pulse Rate: 104 (11/26 0738)  Labs: Recent Labs    08/15/18 2312 08/16/18 0423 08/16/18 0432  HGB 9.1*  --  8.9*  HCT 28.3*  --  27.8*  PLT 285  --  249  LABPROT 14.7  --   --   INR 1.16  --   --   HEPARINUNFRC 0.57 0.39  --   CREATININE 1.23  --   --     Estimated Creatinine Clearance: 42.5 mL/min (by C-G formula based on SCr of 1.23 mg/dL).   Medical History: No past medical history on file.  Medications:  Med rec pending  Assessment: 83 y.o. M transferred from [facility] for NSTEMI w/u. Pt has been on heparin gtt since 11/23 p.m. - therapeutic PTTs on 1000 units/hr for NSTEMI and afib.  Heparin level therapeutic at 0.39 on 1000 units/hr, H/H low but stable, plts wnl.    Goal of Therapy:  Heparin level 0.3-0.7 units/ml Monitor platelets by anticoagulation protocol: Yes   Plan:  Continue heparin gtt at 1000 units/hr Daily heparin level, CBC, s/s bleeding  Bertis Ruddy, PharmD Clinical Pharmacist Please check AMION for all [facility] numbers 08/16/2018 8:46 AM

## 2018-08-16 NOTE — Progress Notes (Signed)
Progress Note  Patient Name: Drew Weber [facility] Date of Encounter: 08/16/2018  Primary Cardiologist:  Dr. Rudi Rummage   Subjective    83 yo with hx of CAD, PAF, dememtia. Prior CVA, Transferred from [place] for acute CHF and Afib  Recent EF is < 15%  He is a 2 weeks of progressive shortness of breath.  He denies any chest pain. Does not eat much salt.  Initial labs were concerning for sepsis syndrome his initial labs were notable for a lactate of 5.4, creatinine of 1.3, and a hemoglobin of approximately 8 He had a rapid atrial fibrillation was treated with IV heparin and diltiazem. He is been aggressively diuresed.  Labs this morning show a potassium of 3.2.  Hemoglobin is 8.9.  Inpatient Medications    Scheduled Meds: . aspirin EC  81 mg Oral Daily  . clopidogrel  75 mg Oral Daily  . donepezil  10 mg Oral QHS  . furosemide  80 mg Intravenous BID  . insulin aspart  0-5 Units Subcutaneous QHS  . insulin aspart  0-9 Units Subcutaneous TID WC  . insulin glargine  10 Units Subcutaneous QHS  . pantoprazole  40 mg Oral Daily  . rosuvastatin  10 mg Oral q1800  . sacubitril-valsartan  1 tablet Oral BID   Continuous Infusions: . heparin     PRN Meds: acetaminophen, nitroGLYCERIN, ondansetron (ZOFRAN) IV   Vital Signs    Vitals:   08/15/18 2136 08/15/18 2331 08/16/18 0513 08/16/18 0738  BP: (!) 123/59 118/63 132/68 93/68  Pulse: (!) 117 (!) 105 (!) 104 (!) 104  Resp: (!) 21 (!) 22  (!) 22  Temp: 99.5 F (37.5 C) (!) 97.3 F (36.3 C) 97.6 F (36.4 C) 97.9 F (36.6 C)  TempSrc: Axillary Oral Oral Oral  SpO2: 97% 96% 99% 100%  Weight: 67.2 kg  64 kg   Height: 5\' 6"  (1.676 m)       Intake/Output Summary (Last 24 hours) at 08/16/2018 1004 Last data filed at 08/16/2018 0818 Gross per 24 hour  Intake 360 ml  Output 825 ml  Net -465 ml   Filed Weights   08/15/18 2136 08/16/18 0513  Weight: 67.2 kg 64 kg    Telemetry    NSR  - Personally Reviewed  ECG       NSR  - Personally Reviewed  Physical Exam   GEN:  Elderly gentleman, feeling much better. Neck: No JVD Cardiac: RRR, .  Respiratory: Clear to auscultation bilaterally. GI: Soft, nontender, non-distended  MS: No edema; No deformity. Neuro:  Nonfocal  Psych: Normal affect   Labs    Chemistry Recent Labs  Lab 08/15/18 2312 08/16/18 0432  NA 139  --   K 2.7* 3.2*  CL 104  --   CO2 27  --   GLUCOSE 263*  --   BUN 25*  --   CREATININE 1.23  --   CALCIUM 8.3*  --   GFRNONAA 53*  --   GFRAA >60  --   ANIONGAP 8  --      Hematology Recent Labs  Lab 08/15/18 2312 08/16/18 0432  WBC 8.4 8.8  RBC 2.95* 2.86*  HGB 9.1* 8.9*  HCT 28.3* 27.8*  MCV 95.9 97.2  MCH 30.8 31.1  MCHC 32.2 32.0  RDW 14.5 14.6  PLT 285 249    Cardiac EnzymesNo results for input(s): TROPONINI in the last 168 hours. No results for input(s): TROPIPOC in the last 168 hours.  BNPNo results for input(s): BNP, PROBNP in the last 168 hours.   DDimer No results for input(s): DDIMER in the last 168 hours.   Radiology    Dg Chest Port 1 View  Result Date: 08/15/2018 CLINICAL DATA:  83 y/o  M; short of breath. EXAM: PORTABLE CHEST 1 VIEW COMPARISON:  08/13/2018 chest radiograph FINDINGS: Stable heart size and mediastinal contours are within normal limits. Both lungs are clear. The visualized skeletal structures are unremarkable. IMPRESSION: No active disease. Electronically Signed   By: Kristine Garbe M.D.   On: 08/15/2018 23:00    Cardiac Studies     Patient Profile     83 y.o. male with acute on chronic congestive heart failure.  He is been diuresed very vigorously at [facility] and is negative 465 cc here .   Assessment & Plan    1.  Acute on chronic systolic congestive heart failure: The patient has a baseline ejection fraction of around 35%.  His most recent echocardiogram from November 24 shows an ejection fraction of 15%.  He has mildly elevated troponin levels.   He was transferred from [facility] for possibility of heart catheterization. Breathing quite a bit better.  2.  Coronary artery disease: He has a known history of coronary artery disease.  We will schedule him for heart catheterization tomorrow. We have discussed the risks, benefits, options of heart catheter station.  He understands and agrees to proceed.  Will schedule him for R / L cath with possible PCI    3.  Paroxysmal atrial fibrillation: Patient is converted to normal sinus rhythm.  The IV diltiazem has been discontinued.  Continue heparin.  We will transition to NOAC after his heart catheterization.  For questions or updates, please contact [place] Please consult [email or web address] for contact info under        Signed, Mertie Moores, MD  08/16/2018, 10:04 AM

## 2018-08-16 NOTE — Progress Notes (Signed)
ANTICOAGULATION CONSULT NOTE - Pharmacy Consult for Heparin Indication: chest pain/ACS, afib  Allergies not on file  Patient Measurements: Height: 5\' 6"  (167.6 cm) Weight: 148 lb 1.6 oz (67.2 kg) IBW/kg (Calculated) : 63.8 Heparin Dosing Weight: 67 kg  Vital Signs: Temp: 97.3 F (36.3 C) (11/25 2331) Temp Source: Oral (11/25 2331) BP: 118/63 (11/25 2331) Pulse Rate: 105 (11/25 2331)  Labs: Recent Labs    08/15/18 2312  HGB 9.1*  HCT 28.3*  PLT 285  LABPROT 14.7  INR 1.16  HEPARINUNFRC 0.57  CREATININE 1.23    Estimated Creatinine Clearance: 42.5 mL/min (by C-G formula based on SCr of 1.23 mg/dL).  Assessment: 83 y.o. male with Afib for heparin  Goal of Therapy:  Heparin level 0.3-0.7 units/ml Monitor platelets by anticoagulation protocol: Yes   Plan:  Continue Heparin at current rate  Follow-up am labs.   Phillis Knack, PharmD, BCPS  08/16/2018,12:00 AM

## 2018-08-16 NOTE — H&P (View-Only) (Signed)
Progress Note  Patient Name: Drew Weber [facility] Date of Encounter: 08/16/2018  Primary Cardiologist:  Dr. Rudi Rummage   Subjective    83 yo with hx of CAD, PAF, dememtia. Prior CVA, Transferred from [place] for acute CHF and Afib  Recent EF is < 15%  He is a 2 weeks of progressive shortness of breath.  He denies any chest pain. Does not eat much salt.  Initial labs were concerning for sepsis syndrome his initial labs were notable for a lactate of 5.4, creatinine of 1.3, and a hemoglobin of approximately 8 He had a rapid atrial fibrillation was treated with IV heparin and diltiazem. He is been aggressively diuresed.  Labs this morning show a potassium of 3.2.  Hemoglobin is 8.9.  Inpatient Medications    Scheduled Meds: . aspirin EC  81 mg Oral Daily  . clopidogrel  75 mg Oral Daily  . donepezil  10 mg Oral QHS  . furosemide  80 mg Intravenous BID  . insulin aspart  0-5 Units Subcutaneous QHS  . insulin aspart  0-9 Units Subcutaneous TID WC  . insulin glargine  10 Units Subcutaneous QHS  . pantoprazole  40 mg Oral Daily  . rosuvastatin  10 mg Oral q1800  . sacubitril-valsartan  1 tablet Oral BID   Continuous Infusions: . heparin     PRN Meds: acetaminophen, nitroGLYCERIN, ondansetron (ZOFRAN) IV   Vital Signs    Vitals:   08/15/18 2136 08/15/18 2331 08/16/18 0513 08/16/18 0738  BP: (!) 123/59 118/63 132/68 93/68  Pulse: (!) 117 (!) 105 (!) 104 (!) 104  Resp: (!) 21 (!) 22  (!) 22  Temp: 99.5 F (37.5 C) (!) 97.3 F (36.3 C) 97.6 F (36.4 C) 97.9 F (36.6 C)  TempSrc: Axillary Oral Oral Oral  SpO2: 97% 96% 99% 100%  Weight: 67.2 kg  64 kg   Height: 5\' 6"  (1.676 m)       Intake/Output Summary (Last 24 hours) at 08/16/2018 1004 Last data filed at 08/16/2018 0818 Gross per 24 hour  Intake 360 ml  Output 825 ml  Net -465 ml   Filed Weights   08/15/18 2136 08/16/18 0513  Weight: 67.2 kg 64 kg    Telemetry    NSR  - Personally Reviewed  ECG       NSR  - Personally Reviewed  Physical Exam   GEN:  Elderly gentleman, feeling much better. Neck: No JVD Cardiac: RRR, .  Respiratory: Clear to auscultation bilaterally. GI: Soft, nontender, non-distended  MS: No edema; No deformity. Neuro:  Nonfocal  Psych: Normal affect   Labs    Chemistry Recent Labs  Lab 08/15/18 2312 08/16/18 0432  NA 139  --   K 2.7* 3.2*  CL 104  --   CO2 27  --   GLUCOSE 263*  --   BUN 25*  --   CREATININE 1.23  --   CALCIUM 8.3*  --   GFRNONAA 53*  --   GFRAA >60  --   ANIONGAP 8  --      Hematology Recent Labs  Lab 08/15/18 2312 08/16/18 0432  WBC 8.4 8.8  RBC 2.95* 2.86*  HGB 9.1* 8.9*  HCT 28.3* 27.8*  MCV 95.9 97.2  MCH 30.8 31.1  MCHC 32.2 32.0  RDW 14.5 14.6  PLT 285 249    Cardiac EnzymesNo results for input(s): TROPONINI in the last 168 hours. No results for input(s): TROPIPOC in the last 168 hours.  BNPNo results for input(s): BNP, PROBNP in the last 168 hours.   DDimer No results for input(s): DDIMER in the last 168 hours.   Radiology    Dg Chest Port 1 View  Result Date: 08/15/2018 CLINICAL DATA:  83 y/o  M; short of breath. EXAM: PORTABLE CHEST 1 VIEW COMPARISON:  08/13/2018 chest radiograph FINDINGS: Stable heart size and mediastinal contours are within normal limits. Both lungs are clear. The visualized skeletal structures are unremarkable. IMPRESSION: No active disease. Electronically Signed   By: Kristine Garbe M.D.   On: 08/15/2018 23:00    Cardiac Studies     Patient Profile     83 y.o. male with acute on chronic congestive heart failure.  He is been diuresed very vigorously at [facility] and is negative 465 cc here .   Assessment & Plan    1.  Acute on chronic systolic congestive heart failure: The patient has a baseline ejection fraction of around 35%.  His most recent echocardiogram from November 24 shows an ejection fraction of 15%.  He has mildly elevated troponin levels.   He was transferred from [facility] for possibility of heart catheterization. Breathing quite a bit better.  2.  Coronary artery disease: He has a known history of coronary artery disease.  We will schedule him for heart catheterization tomorrow. We have discussed the risks, benefits, options of heart catheter station.  He understands and agrees to proceed.  Will schedule him for R / L cath with possible PCI    3.  Paroxysmal atrial fibrillation: Patient is converted to normal sinus rhythm.  The IV diltiazem has been discontinued.  Continue heparin.  We will transition to NOAC after his heart catheterization.  For questions or updates, please contact [place] Please consult [email or web address] for contact info under        Signed, Mertie Moores, MD  08/16/2018, 10:04 AM

## 2018-08-16 NOTE — Progress Notes (Addendum)
CRITICAL VALUE ALERT  Critical Value:  Potassium 2.7  Date & Time Notied:  08/16/2018 0005  Provider Notified: Chakravartti 0008 [address] [place]  Orders Received/Actions taken: Total of 80 mEq of po potassium given to pt per order

## 2018-08-17 ENCOUNTER — Encounter (HOSPITAL_COMMUNITY): Payer: Self-pay | Admitting: General Practice

## 2018-08-17 ENCOUNTER — Other Ambulatory Visit: Payer: Self-pay

## 2018-08-17 ENCOUNTER — Encounter (HOSPITAL_COMMUNITY)
Admission: AD | Disposition: A | Discharge status: Skilled Nursing Facility | Payer: Self-pay | Source: Other Acute Inpatient Hospital | Attending: Cardiology

## 2018-08-17 DIAGNOSIS — I25119 Atherosclerotic heart disease of native coronary artery with unspecified angina pectoris: Secondary | ICD-10-CM

## 2018-08-17 DIAGNOSIS — I428 Other cardiomyopathies: Secondary | ICD-10-CM

## 2018-08-17 HISTORY — PX: RIGHT/LEFT HEART CATH AND CORONARY ANGIOGRAPHY: CATH118266

## 2018-08-17 LAB — POCT I-STAT 3, ART BLOOD GAS (G3+)
Acid-Base Excess: 5 mmol/L — ABNORMAL HIGH (ref 0.0–2.0)
BICARBONATE: 27.9 mmol/L (ref 20.0–28.0)
O2 Saturation: 99 %
PH ART: 7.53 — AB (ref 7.350–7.450)
PO2 ART: 101 mmHg (ref 83.0–108.0)
TCO2: 29 mmol/L (ref 22–32)
pCO2 arterial: 33.4 mmHg (ref 32.0–48.0)

## 2018-08-17 LAB — CBC
HCT: 29.4 % — ABNORMAL LOW (ref 39.0–52.0)
HCT: 31.3 % — ABNORMAL LOW (ref 39.0–52.0)
Hemoglobin: 9.3 g/dL — ABNORMAL LOW (ref 13.0–17.0)
Hemoglobin: 10 g/dL — ABNORMAL LOW (ref 13.0–17.0)
MCH: 30.6 pg (ref 26.0–34.0)
MCH: 30.4 pg (ref 26.0–34.0)
MCHC: 31.6 g/dL (ref 30.0–36.0)
MCHC: 31.9 g/dL (ref 30.0–36.0)
MCV: 96.7 fL (ref 80.0–100.0)
MCV: 95.1 fL (ref 80.0–100.0)
NRBC: 0 % (ref 0.0–0.2)
PLATELETS: 255 10*3/uL (ref 150–400)
Platelets: 271 10*3/uL (ref 150–400)
RBC: 3.04 MIL/uL — ABNORMAL LOW (ref 4.22–5.81)
RBC: 3.29 MIL/uL — AB (ref 4.22–5.81)
RDW: 14.3 % (ref 11.5–15.5)
RDW: 14.3 % (ref 11.5–15.5)
WBC: 7.2 10*3/uL (ref 4.0–10.5)
WBC: 7 10*3/uL (ref 4.0–10.5)
nRBC: 0 % (ref 0.0–0.2)

## 2018-08-17 LAB — POCT I-STAT 3, VENOUS BLOOD GAS (G3P V)
ACID-BASE EXCESS: 7 mmol/L — AB (ref 0.0–2.0)
ACID-BASE EXCESS: 8 mmol/L — AB (ref 0.0–2.0)
BICARBONATE: 32.2 mmol/L — AB (ref 20.0–28.0)
Bicarbonate: 31.7 mmol/L — ABNORMAL HIGH (ref 20.0–28.0)
O2 SAT: 53 %
O2 Saturation: 60 %
PCO2 VEN: 45.2 mmHg (ref 44.0–60.0)
PCO2 VEN: 46 mmHg (ref 44.0–60.0)
PH VEN: 7.461 — AB (ref 7.250–7.430)
PO2 VEN: 27 mmHg — AB (ref 32.0–45.0)
PO2 VEN: 30 mmHg — AB (ref 32.0–45.0)
TCO2: 33 mmol/L — AB (ref 22–32)
TCO2: 34 mmol/L — ABNORMAL HIGH (ref 22–32)
pH, Ven: 7.446 — ABNORMAL HIGH (ref 7.250–7.430)

## 2018-08-17 LAB — GLUCOSE, CAPILLARY
GLUCOSE-CAPILLARY: 151 mg/dL — AB (ref 70–99)
GLUCOSE-CAPILLARY: 164 mg/dL — AB (ref 70–99)
Glucose-Capillary: 181 mg/dL — ABNORMAL HIGH (ref 70–99)
Glucose-Capillary: 150 mg/dL — ABNORMAL HIGH (ref 70–99)
Glucose-Capillary: 154 mg/dL — ABNORMAL HIGH (ref 70–99)

## 2018-08-17 LAB — CREATININE, SERUM
CREATININE: 1.29 mg/dL — AB (ref 0.61–1.24)
GFR calc Af Amer: 60 mL/min — ABNORMAL LOW (ref >60)
GFR calc non Af Amer: 52 mL/min — ABNORMAL LOW (ref >60)

## 2018-08-17 LAB — FOLATE RBC
Folate, Hemolysate: 425.8 ng/mL
Folate, RBC: 1560 ng/mL (ref >498)
Hematocrit: 27.3 % — ABNORMAL LOW (ref 37.5–51.0)

## 2018-08-17 LAB — POCT ACTIVATED CLOTTING TIME: Activated Clotting Time: 114 seconds

## 2018-08-17 LAB — HEPARIN LEVEL (UNFRACTIONATED): Heparin Unfractionated: <0.1 IU/mL — ABNORMAL LOW (ref 0.30–0.70)

## 2018-08-17 SURGERY — RIGHT/LEFT HEART CATH AND CORONARY ANGIOGRAPHY
Anesthesia: LOCAL

## 2018-08-17 MED ORDER — HEPARIN (PORCINE) IN NACL 1000-0.9 UT/500ML-% IV SOLN
INTRAVENOUS | Status: AC
  Filled 2018-08-17: qty 1000

## 2018-08-17 MED ORDER — LIDOCAINE HCL (PF) 1 % IJ SOLN
INTRAMUSCULAR | Status: DC | PRN
  Administered 2018-08-17: 30 mL

## 2018-08-17 MED ORDER — ONDANSETRON HCL 4 MG/2ML IJ SOLN: 4.0000 mg | Freq: Four times a day (QID) | INTRAMUSCULAR | Status: DC | PRN

## 2018-08-17 MED ORDER — IOHEXOL 350 MG/ML SOLN
INTRAVENOUS | Status: DC | PRN
  Administered 2018-08-17: 30 mL via INTRAVENOUS

## 2018-08-17 MED ORDER — SODIUM CHLORIDE 0.9 % IV SOLN
INTRAVENOUS | Status: DC
  Administered 2018-08-17: 06:00:00 via INTRAVENOUS

## 2018-08-17 MED ORDER — SODIUM CHLORIDE 0.9% FLUSH
3.0000 mL | INTRAVENOUS | Status: DC | PRN
  Administered 2018-08-19 – 2018-08-20 (×2): 3 mL via INTRAVENOUS
  Filled 2018-08-17 (×2): qty 3

## 2018-08-17 MED ORDER — DIAZEPAM 2 MG PO TABS
2.0000 mg | ORAL_TABLET | Freq: Four times a day (QID) | ORAL | Status: DC | PRN
  Administered 2018-08-21: 2 mg via ORAL
  Filled 2018-08-17: qty 1

## 2018-08-17 MED ORDER — ASPIRIN 81 MG PO CHEW: 81.0000 mg | CHEWABLE_TABLET | Freq: Every day | ORAL | Status: DC

## 2018-08-17 MED ORDER — HEPARIN SODIUM (PORCINE) 5000 UNIT/ML IJ SOLN
5000.0000 [IU] | Freq: Three times a day (TID) | INTRAMUSCULAR | Status: DC
  Administered 2018-08-18 – 2018-08-22 (×15): 5000 [IU] via SUBCUTANEOUS
  Filled 2018-08-17 (×15): qty 1

## 2018-08-17 MED ORDER — HEPARIN (PORCINE) IN NACL 1000-0.9 UT/500ML-% IV SOLN
INTRAVENOUS | Status: DC | PRN
  Administered 2018-08-17 (×2): 500 mL

## 2018-08-17 MED ORDER — ACETAMINOPHEN 325 MG PO TABS: 650.0000 mg | ORAL_TABLET | ORAL | Status: DC | PRN

## 2018-08-17 MED ORDER — LIDOCAINE HCL (PF) 1 % IJ SOLN
INTRAMUSCULAR | Status: AC
  Filled 2018-08-17: qty 30

## 2018-08-17 MED ORDER — SODIUM CHLORIDE 0.9% FLUSH
3.0000 mL | Freq: Two times a day (BID) | INTRAVENOUS | Status: DC
  Administered 2018-08-17 – 2018-08-22 (×10): 3 mL via INTRAVENOUS

## 2018-08-17 MED ORDER — SODIUM CHLORIDE 0.9 % IV SOLN
INTRAVENOUS | Status: AC
  Administered 2018-08-17: 12:00:00 via INTRAVENOUS

## 2018-08-17 MED ORDER — SODIUM CHLORIDE 0.9 % IV SOLN: 250.0000 mL | INTRAVENOUS | Status: DC | PRN

## 2018-08-17 SURGICAL SUPPLY — 12 items
CATH INFINITI 5FR MULTPACK ANG (CATHETERS) ×1 IMPLANT
CATH SWAN GANZ 7F STRAIGHT (CATHETERS) ×2 IMPLANT
KIT HEART LEFT (KITS) ×2 IMPLANT
PACK CARDIAC CATHETERIZATION (CUSTOM PROCEDURE TRAY) ×2 IMPLANT
SHEATH PINNACLE 5F 10CM (SHEATH) ×1 IMPLANT
SHEATH PINNACLE 7F 10CM (SHEATH) ×1 IMPLANT
SHEATH PROBE COVER 6X72 (BAG) ×1 IMPLANT
TRANSDUCER W/STOPCOCK (MISCELLANEOUS) ×2 IMPLANT
TUBING CIL FLEX 10 FLL-RA (TUBING) ×2 IMPLANT
WIRE EMERALD 3MM-J .025X260CM (WIRE) ×2 IMPLANT
WIRE EMERALD 3MM-J .035X150CM (WIRE) ×4 IMPLANT
WIRE HI TORQ VERSACORE-J 145CM (WIRE) ×1 IMPLANT

## 2018-08-17 NOTE — CV Procedure (Signed)
  5 Fr. R F/A and 7 Fr. R F/V shea'ts were removed with manual pull.R groin is soft and non tender. Patient tolerated the procedure well. He was given instructions on his bed rest.which started at 1045, X 4 hr. HR - 100 BP - 102/56 SPO2 on 2 L [place] - 97 % Sleep apnea pattern was noted, with apneic periods of 20 - 25 sec.

## 2018-08-17 NOTE — Interval H&P Note (Signed)
Cath Lab Visit (complete for each Cath Lab visit)  Clinical Evaluation Leading to the Procedure:   ACS: No.  Non-ACS:    Anginal Classification: CCS III  Anti-ischemic medical therapy: Minimal Therapy (1 class of medications)  Non-Invasive Test Results: No non-invasive testing performed  Prior CABG: No previous CABG      History and Physical Interval Note:  08/17/2018 8:43 AM  Drew Weber  has presented today for surgery, with the diagnosis of cad - hf  The various methods of treatment have been discussed with the patient and family. After consideration of risks, benefits and other options for treatment, the patient has consented to  Procedure(s): RIGHT/LEFT HEART CATH AND CORONARY ANGIOGRAPHY (N/A) as a surgical intervention .  The patient's history has been reviewed, patient examined, no change in status, stable for surgery.  I have reviewed the patient's chart and labs.  Questions were answered to the patient's satisfaction.     Shelva Majestic

## 2018-08-17 NOTE — Progress Notes (Addendum)
Patient noted to be hypotensive earlier during cath, most recent BP 116/60. He did receive both doses of IV Lasix today. Filling pressures OK by cath. Per d/w Dr. Acie Fredrickson, will pause further Lasix and continue Entresto with hold parameters. The patient may be a dc tomorrow so decisions can be made in the AM about conversion to oral diuretic at that time. Potassium remains on MAR given administration of Lasix today, and hypokalemia earlier this admission. Dr. Acie Fredrickson indicates patient will be a dc in next 24-48 hours if does OK; f/u location will need to be determined based on where patient is discharged to. PT/OT pending for possible SNF.  Dayna Dunn PA-C

## 2018-08-17 NOTE — Progress Notes (Addendum)
    83 year old gentleman with a history of nonischemic cardiomyopathy.  He is followed in [place]  by Dr.  Elliot Dally. Otho Perl, MD   [phone].  He was seen in the [facility] and was transferred here 2 days ago for heart catheterization for further evaluation of a markedly decreased LVEF.  His most recent echocardiogram showed an EF of 15%.  Catheterization today reveals an mild to moderate coronary artery disease. Ventriculogram was not performed. The filling pressures were in the normal range.  Discussed the case with Dr. Otho Perl.  There is really not anything further that we would be able to offer the patient at this time.  His blood pressure is marginal.  He has moderate to severe dementia and really cannot participate in the exam or in his decision-making.  His wife answers all of the questions and makes all the decisions.  The patient has refused ICD in the past and is a DNR.  I think that the patient should be able to be discharged home tomorrow and will follow up with Dr. Otho Perl.     His hemodynamics are stable.  Ive ordered PT and OT evaluation to ensure that he is safe to return home.     Mertie Moores, MD  08/17/2018 3:21 PM    [place],  [place], [place]  [address] Pager [phone] Phone: [phone]; Fax: [phone]

## 2018-08-17 NOTE — Progress Notes (Signed)
PT Cancellation Note  Patient Details Name: Drew Weber MRN: [id number] DOB: 1935-01-10   Cancelled Treatment:    Reason Eval/Treat Not Completed: Patient at procedure or test/unavailable(Pt in [facility] lab.  Will return as able. )   [name] 08/17/2018, 8:58 AM [name],PT Acute Rehabilitation Services Pager:  [phone]  Office:  [phone]

## 2018-08-18 LAB — BASIC METABOLIC PANEL
Anion gap: 14 (ref 5–15)
BUN: 25 mg/dL — ABNORMAL HIGH (ref 8–23)
CALCIUM: 8.8 mg/dL — AB (ref 8.9–10.3)
CO2: 25 mmol/L (ref 22–32)
Chloride: 101 mmol/L (ref 98–111)
Creatinine, Ser: 1.47 mg/dL — ABNORMAL HIGH (ref 0.61–1.24)
GFR calc non Af Amer: 44 mL/min — ABNORMAL LOW (ref >60)
GFR, EST AFRICAN AMERICAN: 51 mL/min — AB (ref >60)
Glucose, Bld: 165 mg/dL — ABNORMAL HIGH (ref 70–99)
POTASSIUM: 4 mmol/L (ref 3.5–5.1)
SODIUM: 140 mmol/L (ref 135–145)

## 2018-08-18 LAB — CBC
HCT: 30.8 % — ABNORMAL LOW (ref 39.0–52.0)
Hemoglobin: 9.8 g/dL — ABNORMAL LOW (ref 13.0–17.0)
MCH: 30.3 pg (ref 26.0–34.0)
MCHC: 31.8 g/dL (ref 30.0–36.0)
MCV: 95.4 fL (ref 80.0–100.0)
NRBC: 0 % (ref 0.0–0.2)
PLATELETS: 291 10*3/uL (ref 150–400)
RBC: 3.23 MIL/uL — AB (ref 4.22–5.81)
RDW: 14.4 % (ref 11.5–15.5)
WBC: 8.9 10*3/uL (ref 4.0–10.5)

## 2018-08-18 LAB — GLUCOSE, CAPILLARY
GLUCOSE-CAPILLARY: 157 mg/dL — AB (ref 70–99)
GLUCOSE-CAPILLARY: 205 mg/dL — AB (ref 70–99)
GLUCOSE-CAPILLARY: 175 mg/dL — AB (ref 70–99)
Glucose-Capillary: 156 mg/dL — ABNORMAL HIGH (ref 70–99)

## 2018-08-18 MED ORDER — CARVEDILOL 3.125 MG PO TABS
3.1250 mg | ORAL_TABLET | Freq: Two times a day (BID) | ORAL | Status: DC
  Administered 2018-08-18 – 2018-08-20 (×4): 3.125 mg via ORAL
  Filled 2018-08-18 (×4): qty 1

## 2018-08-18 NOTE — Progress Notes (Signed)
DAILY PROGRESS NOTE   Patient Name: Drew Weber [facility] Date of Encounter: 08/18/2018  Chief Complaint   No complaints  Patient Profile   83 year old man with chronic systolic heart failure the setting of coronary artery disease who presents with acute decompensated heart failure.  Subjective   Telemetry shows sinus tach today. Cath yesterday showed moderate non-obstructive CAD - LVEF reportedly reduced to 15% -  Has been bed-bound for more than a week. Will need PT evaluation today.  Objective   Vitals:   08/17/18 2002 08/17/18 2346 08/18/18 0417 08/18/18 0810  BP: (!) 82/66 94/62 101/67 105/64  Pulse:  (!) 114 (!) 115 (!) 113  Resp:  20 12 20   Temp:  98.4 F (36.9 C) (!) 97.3 F (36.3 C)   TempSrc:  Oral Oral   SpO2:  98% 93% 98%  Weight:   63.8 kg   Height:        Intake/Output Summary (Last 24 hours) at 08/18/2018 3532 Last data filed at 08/18/2018 0800 Gross per 24 hour  Intake 237.84 ml  Output 1350 ml  Net -1112.16 ml   Filed Weights   08/16/18 0513 08/17/18 0555 08/18/18 0417  Weight: 64 kg 62.5 kg 63.8 kg    Physical Exam   General appearance: alert and no distress Lungs: clear to auscultation bilaterally Heart: regular rate and rhythm Extremities: extremities normal, atraumatic, no cyanosis or edema Neurologic: Grossly normal  Inpatient Medications    Scheduled Meds: . aspirin EC  81 mg Oral Daily  . clopidogrel  75 mg Oral Daily  . donepezil  10 mg Oral QHS  . heparin  5,000 Units Subcutaneous Q8H  . insulin aspart  0-5 Units Subcutaneous QHS  . insulin aspart  0-9 Units Subcutaneous TID WC  . insulin glargine  10 Units Subcutaneous QHS  . mouth rinse  15 mL Mouth Rinse BID  . pantoprazole  40 mg Oral Daily  . potassium chloride  20 mEq Oral BID  . rosuvastatin  10 mg Oral q1800  . sacubitril-valsartan  1 tablet Oral BID  . sodium chloride flush  3 mL Intravenous Q12H    Continuous Infusions: . sodium chloride 20 mL/hr at 08/17/18  0630  . sodium chloride      PRN Meds: sodium chloride, acetaminophen, diazepam, nitroGLYCERIN, ondansetron (ZOFRAN) IV, sodium chloride flush   Labs   Results for orders placed or performed during the hospital encounter of 08/15/18 (from the past 48 hour(s))  Troponin I - Once     Status: Abnormal   Collection Time: 08/16/18 10:55 AM  Result Value Ref Range   Troponin I 0.77 (HH) <0.03 ng/mL    Comment: SLIGHT HEMOLYSIS CRITICAL RESULT CALLED TO, READ BACK BY AND VERIFIED WITH: S.COOPER,RN 1222 08/16/18 CLARK,S Performed at [facility], [place] [address] [place], [place] [address]   Basic metabolic panel     Status: Abnormal   Collection Time: 08/16/18 10:55 AM  Result Value Ref Range   Sodium 139 135 - 145 mmol/L   Potassium 4.0 3.5 - 5.1 mmol/L    Comment: SLIGHT HEMOLYSIS   Chloride 102 98 - 111 mmol/L   CO2 27 22 - 32 mmol/L   Glucose, Bld 208 (H) 70 - 99 mg/dL   BUN 24 (H) 8 - 23 mg/dL   Creatinine, Ser 1.24 0.61 - 1.24 mg/dL   Calcium 8.4 (L) 8.9 - 10.3 mg/dL   GFR calc non Af Amer 54 (L) >60 mL/min   GFR  calc Af Amer >60 >60 mL/min   Anion gap 10 5 - 15    Comment: Performed at [facility] [address], [place], [place] [address]  Glucose, capillary     Status: Abnormal   Collection Time: 08/16/18 11:34 AM  Result Value Ref Range   Glucose-Capillary 181 (H) 70 - 99 mg/dL  Glucose, capillary     Status: Abnormal   Collection Time: 08/16/18  3:47 PM  Result Value Ref Range   Glucose-Capillary 209 (H) 70 - 99 mg/dL  Glucose, capillary     Status: Abnormal   Collection Time: 08/16/18  5:08 PM  Result Value Ref Range   Glucose-Capillary 190 (H) 70 - 99 mg/dL  Glucose, capillary     Status: Abnormal   Collection Time: 08/16/18  9:16 PM  Result Value Ref Range   Glucose-Capillary 269 (H) 70 - 99 mg/dL  Heparin level (unfractionated)     Status: Abnormal   Collection Time: 08/17/18  4:22 AM  Result Value Ref Range   Heparin Unfractionated <0.10 (L)  0.30 - 0.70 IU/mL    Comment: REPEATED TO VERIFY (NOTE) If heparin results are below expected values, and patient dosage has  been confirmed, suggest follow up testing of antithrombin III levels. Performed at [facility], [place] [address], [place], [place] [address]   CBC     Status: Abnormal   Collection Time: 08/17/18  4:22 AM  Result Value Ref Range   WBC 7.2 4.0 - 10.5 K/uL   RBC 3.04 (L) 4.22 - 5.81 MIL/uL   Hemoglobin 9.3 (L) 13.0 - 17.0 g/dL   HCT 29.4 (L) 39.0 - 52.0 %   MCV 96.7 80.0 - 100.0 fL   MCH 30.6 26.0 - 34.0 pg   MCHC 31.6 30.0 - 36.0 g/dL   RDW 14.3 11.5 - 15.5 %   Platelets 255 150 - 400 K/uL   nRBC 0.0 0.0 - 0.2 %    Comment: Performed at [facility], [place] [address], [place], [place] [address]  Glucose, capillary     Status: Abnormal   Collection Time: 08/17/18  7:38 AM  Result Value Ref Range   Glucose-Capillary 181 (H) 70 - 99 mg/dL  I-STAT 3, venous blood gas (G3P V)     Status: Abnormal   Collection Time: 08/17/18  9:25 AM  Result Value Ref Range   pH, Ven 7.461 (H) 7.250 - 7.430   pCO2, Ven 45.2 44.0 - 60.0 mmHg   pO2, Ven 30.0 (LL) 32.0 - 45.0 mmHg   Bicarbonate 32.2 (H) 20.0 - 28.0 mmol/L   TCO2 34 (H) 22 - 32 mmol/L   O2 Saturation 60.0 %   Acid-Base Excess 8.0 (H) 0.0 - 2.0 mmol/L   Patient temperature HIDE    Sample type VENOUS    Comment NOTIFIED PHYSICIAN   I-STAT 3, venous blood gas (G3P V)     Status: Abnormal   Collection Time: 08/17/18  9:29 AM  Result Value Ref Range   pH, Ven 7.446 (H) 7.250 - 7.430   pCO2, Ven 46.0 44.0 - 60.0 mmHg   pO2, Ven 27.0 (LL) 32.0 - 45.0 mmHg   Bicarbonate 31.7 (H) 20.0 - 28.0 mmol/L   TCO2 33 (H) 22 - 32 mmol/L   O2 Saturation 53.0 %   Acid-Base Excess 7.0 (H) 0.0 - 2.0 mmol/L   Patient temperature HIDE    Sample type VENOUS    Comment NOTIFIED PHYSICIAN   I-STAT  3, arterial blood gas (G3+)     Status: Abnormal   Collection Time: 08/17/18  9:32 AM  Result Value Ref Range   pH,  Arterial 7.530 (H) 7.350 - 7.450   pCO2 arterial 33.4 32.0 - 48.0 mmHg   pO2, Arterial 101.0 83.0 - 108.0 mmHg   Bicarbonate 27.9 20.0 - 28.0 mmol/L   TCO2 29 22 - 32 mmol/L   O2 Saturation 99.0 %   Acid-Base Excess 5.0 (H) 0.0 - 2.0 mmol/L   Patient temperature HIDE    Sample type ARTERIAL   POCT Activated clotting time     Status: None   Collection Time: 08/17/18  9:45 AM  Result Value Ref Range   Activated Clotting Time 114 seconds  Glucose, capillary     Status: Abnormal   Collection Time: 08/17/18 10:07 AM  Result Value Ref Range   Glucose-Capillary 151 (H) 70 - 99 mg/dL  Glucose, capillary     Status: Abnormal   Collection Time: 08/17/18 11:30 AM  Result Value Ref Range   Glucose-Capillary 164 (H) 70 - 99 mg/dL  CBC     Status: Abnormal   Collection Time: 08/17/18 11:54 AM  Result Value Ref Range   WBC 7.0 4.0 - 10.5 K/uL   RBC 3.29 (L) 4.22 - 5.81 MIL/uL   Hemoglobin 10.0 (L) 13.0 - 17.0 g/dL   HCT 31.3 (L) 39.0 - 52.0 %   MCV 95.1 80.0 - 100.0 fL   MCH 30.4 26.0 - 34.0 pg   MCHC 31.9 30.0 - 36.0 g/dL   RDW 14.3 11.5 - 15.5 %   Platelets 271 150 - 400 K/uL   nRBC 0.0 0.0 - 0.2 %    Comment: Performed at [facility], [place] [address], [place], [place] [address]  Creatinine, serum     Status: Abnormal   Collection Time: 08/17/18 11:54 AM  Result Value Ref Range   Creatinine, Ser 1.29 (H) 0.61 - 1.24 mg/dL   GFR calc non Af Amer 52 (L) >60 mL/min   GFR calc Af Amer 60 (L) >60 mL/min    Comment: Performed at [facility] [address] [place], [place] [address]  Glucose, capillary     Status: Abnormal   Collection Time: 08/17/18  3:57 PM  Result Value Ref Range   Glucose-Capillary 150 (H) 70 - 99 mg/dL  Glucose, capillary     Status: Abnormal   Collection Time: 08/17/18  9:47 PM  Result Value Ref Range   Glucose-Capillary 154 (H) 70 - 99 mg/dL  CBC     Status: Abnormal   Collection Time: 08/18/18  4:00 AM  Result Value Ref Range   WBC 8.9 4.0 -  10.5 K/uL   RBC 3.23 (L) 4.22 - 5.81 MIL/uL   Hemoglobin 9.8 (L) 13.0 - 17.0 g/dL   HCT 30.8 (L) 39.0 - 52.0 %   MCV 95.4 80.0 - 100.0 fL   MCH 30.3 26.0 - 34.0 pg   MCHC 31.8 30.0 - 36.0 g/dL   RDW 14.4 11.5 - 15.5 %   Platelets 291 150 - 400 K/uL   nRBC 0.0 0.0 - 0.2 %    Comment: Performed at [facility], [place] [address], [place], [place] [address]  Basic metabolic panel     Status: Abnormal   Collection Time: 08/18/18  4:00 AM  Result Value Ref Range   Sodium 140 135 - 145 mmol/L   Potassium 4.0 3.5 - 5.1 mmol/L  Chloride 101 98 - 111 mmol/L   CO2 25 22 - 32 mmol/L   Glucose, Bld 165 (H) 70 - 99 mg/dL   BUN 25 (H) 8 - 23 mg/dL   Creatinine, Ser 1.47 (H) 0.61 - 1.24 mg/dL   Calcium 8.8 (L) 8.9 - 10.3 mg/dL   GFR calc non Af Amer 44 (L) >60 mL/min   GFR calc Af Amer 51 (L) >60 mL/min   Anion gap 14 5 - 15    Comment: Performed at [facility] [address], [place], [place] [address]  Glucose, capillary     Status: Abnormal   Collection Time: 08/18/18  7:39 AM  Result Value Ref Range   Glucose-Capillary 156 (H) 70 - 99 mg/dL    ECG   N/A  Telemetry   Sinus tachycardia - Personally Reviewed  Radiology    No results found.  Cardiac Studies   RIGHT/LEFT HEART CATH AND CORONARY ANGIOGRAPHY  Conclusion     Ost LAD to Prox LAD lesion is 10% stenosed.  Prox LAD to Mid LAD lesion is 5% stenosed.  Ost 1st Diag to 1st Diag lesion is 5% stenosed.  2nd Mrg lesion is 70% stenosed.  Dist RCA-1 lesion is 20% stenosed.  Dist RCA-2 lesion is 20% stenosed.   Moderate coronary obstructive disease with mild calcification of the proximal LAD with widely patent stents in the diagonal and mid LAD; normal ramus intermediate; left circumflex vessel with focal 70% stenosis in a small marginal branch; dominant RCA with mild 20% tandem distal stenoses prior to the PDA takeoff.  Low right heart pressures with initial PW mean at 4.  His recent echo  documentation of EF of 50%, findings are consistent with HFrEF with nonischemic cardiomyopathy.  RECOMMENDATION: Guideline directed medical therapy for acute on chronic combined heart failure.  Medical therapy for CAD. Patient has been on chronic dual antiplatelet therapy.    Assessment   1. Active Problems: 2.   Atrial fibrillation ([place]) 3.   Acute on chronic systolic (congestive) heart failure ([place]) 4.   NICM (nonischemic cardiomyopathy) ([place]) 5.   Plan   1. Will need PT eval today (patient in cath lab yesterday) - seems weak and may benefit from SNF. Remains tachycardic - he is dry based on RHC results. BP low normal - will try low dose coreg 3.25 mg BID. Would hold lasix today - consider low dose oral diuretic at discharge.  Time Spent Directly with Patient:  I have spent a total of 25 minutes with the patient reviewing hospital notes, telemetry, EKGs, labs and examining the patient as well as establishing an assessment and plan that was discussed personally with the patient.  > 50% of time was spent in direct patient care.  Length of Stay:  LOS: 3 days   Pixie Casino, MD, [facility], [place] Director of the Advanced Lipid Disorders &  Cardiovascular Risk Reduction Clinic Diplomate of the American Board of Clinical Lipidology Attending Cardiologist  Direct Dial: [phone]  Fax: [phone]  Website:  [email or web address] 08/18/2018, 9:04 AM

## 2018-08-18 NOTE — Evaluation (Signed)
Physical Therapy Evaluation Patient Details Name: Drew Weber MRN: [id number] DOB: 04-02-1935 Today's Date: 08/18/2018   History of Present Illness  Pt is a 83 y.o. M with significant PMH of CVA, dementia, CAD, and PAF who presents with acute decompensated heart failure. Cath showed moderate non obstructive CAD - LVEF reportedly reduced to 15%.  Clinical Impression  Prior to admission, patient ambulatory with cane and requires assist with ADL's. Pt wife reports he has had 2 falls recently. On PT evaluation, pt presenting with significantly decreased functional mobility secondary to balance impairments, decreased activity tolerance and deconditioning/debility. Upon arrival, pt HR 120 bpm at rest with subsequent increase to 130 bpm with limited activity. Pt requiring minimal assistance to stand with walker and despite 3 attempts to ambulate, would sit down abruptly with decreased eccentric control. Denies dizziness/lightheadedness, BP prior to mobility 95/57, post mobility 111/57. Pt presents as a high fall risk based on deficits listed above and recurrent falls. Recommend SNF to maximize functional independence and decrease caregiver burden. Pt wife in agreement as she does not feel like she can manage him currently. Will follow acutely.    Follow Up Recommendations SNF;Supervision/Assistance - 24 hour    Equipment Recommendations  Rolling walker with 5" wheels    Recommendations for Other Services OT consult     Precautions / Restrictions Precautions Precautions: Fall Restrictions Weight Bearing Restrictions: No      Mobility  Bed Mobility               General bed mobility comments: OOB in chair  Transfers Overall transfer level: Needs assistance Equipment used: Rolling walker (2 wheeled) Transfers: Sit to/from Stand Sit to Stand: Min assist         General transfer comment: Serial sit to stands x 3. Min assist provided for lift assist with RW stabilized as pt  prefers to push up with hands from walker despite multimodal cueing for pushing up from recliner chair arms. Posterior lean upon standing, indicative of high fall risk  Ambulation/Gait Ambulation/Gait assistance: Min assist Gait Distance (Feet): 1 Feet Assistive device: Rolling walker (2 wheeled) Gait Pattern/deviations: Step-through pattern;Trunk flexed;Decreased stride length     General Gait Details: 3 attempts for further ambulation, however, pt taking a couple steps forward but then abruptly sitting down without warning. Denies dizziness or fatigue, however difficult to assess secondary to cognitive impairments  Stairs            Wheelchair Mobility    Modified Rankin (Stroke Patients Only)       Balance Overall balance assessment: Needs assistance Sitting-balance support: Feet supported Sitting balance-Leahy Scale: Good     Standing balance support: Bilateral upper extremity supported Standing balance-Leahy Scale: Poor Standing balance comment: reliant on external support                             Pertinent Vitals/Pain Pain Assessment: No/denies pain    Home Living Family/patient expects to be discharged to:: Private residence Living Arrangements: Spouse/significant other Available Help at Discharge: Family;Available 24 hours/day Type of Home: House Home Access: Stairs to enter   [employer] of Steps: 1(threshold) Home Layout: Able to live on main level with bedroom/bathroom Home Equipment: Cane - single point      Prior Function Level of Independence: Needs assistance   Gait / Transfers Assistance Needed: ambulates with cane. pt wife reports history of 2 falls recently  ADL's / Homemaking Assistance Needed: wife  assists husband into shower and set up for ADL's        Hand Dominance        Extremity/Trunk Assessment   Upper Extremity Assessment Upper Extremity Assessment: Overall WFL for tasks assessed    Lower  Extremity Assessment Lower Extremity Assessment: Overall WFL for tasks assessed    Cervical / Trunk Assessment Cervical / Trunk Assessment: Kyphotic  Communication   Communication: HOH  Cognition Arousal/Alertness: Awake/alert Behavior During Therapy: WFL for tasks assessed/performed Overall Cognitive Status: History of cognitive impairments - at baseline                                 General Comments: Pleasantly confused with history of dementia. Pt wife reports he is at baseline. Pt follows simple commands consistently but has decreased safety awareness and poor problem solving abilities      General Comments      Exercises     Assessment/Plan    PT Assessment Patient needs continued PT services  PT Problem List Decreased strength;Decreased activity tolerance;Decreased balance;Decreased mobility;Decreased coordination;Decreased cognition;Decreased safety awareness;Decreased knowledge of use of DME;Cardiopulmonary status limiting activity       PT Treatment Interventions DME instruction;Gait training;Functional mobility training;Therapeutic activities;Therapeutic exercise;Balance training;Patient/family education    PT Goals (Current goals can be found in the Care Plan section)  Acute Rehab PT Goals Patient Stated Goal: none stated by pt; pt wife is agreeable for ST rehab PT Goal Formulation: With patient/family Time For Goal Achievement: 09/01/18 Potential to Achieve Goals: Good    Frequency Min 3X/week   Barriers to discharge        Co-evaluation               AM-PAC PT "6 Clicks" Mobility  Outcome Measure Help needed turning from your back to your side while in a flat bed without using bedrails?: A Little Help needed moving from lying on your back to sitting on the side of a flat bed without using bedrails?: A Little Help needed moving to and from a bed to a chair (including a wheelchair)?: A Little Help needed standing up from a chair  using your arms (e.g., wheelchair or bedside chair)?: A Little Help needed to walk in hospital room?: A Lot Help needed climbing 3-5 steps with a railing? : Total 6 Click Score: 15    End of Session Equipment Utilized During Treatment: Gait belt Activity Tolerance: Patient tolerated treatment well Patient left: in chair;with call bell/phone within reach;with chair alarm set;with family/visitor present Nurse Communication: Mobility status PT Visit Diagnosis: Unsteadiness on feet (R26.81);Difficulty in walking, not elsewhere classified (R26.2)    Time: 0938-1829 PT Time Calculation (min) (ACUTE ONLY): 22 min   Charges:   PT Evaluation $PT Eval Moderate Complexity: 1 Mod          Ellamae Sia, [place], DPT Acute Rehabilitation Services Pager [phone] Office [phone]  Willy Eddy 08/18/2018, 9:56 AM

## 2018-08-18 NOTE — NC FL2 (Signed)
[place] LEVEL OF CARE SCREENING TOOL     IDENTIFICATION  Patient Name: Drew Weber Birthdate: 1935/01/08 Sex: male Admission Date (Current Location): 08/15/2018  [facility] and [place] Number:  Herbalist and Address:  [facility] [place], [place] [address], [place], [place] [address]      Provider Number: [id number]  Attending Physician Name and Address:  Minus Breeding, MD  Relative Name and Phone Number:  Lesleigh Noe spouse, [phone]    Current Level of Care: Hospital Recommended Level of Care: [place] Prior Approval Number:    Date Approved/Denied:   PASRR Number: [id number]  Discharge Plan: SNF    Current Diagnoses: Patient Active Problem List   Diagnosis Date Noted  . NICM (nonischemic cardiomyopathy) ([place])   . Atrial fibrillation ([place]) 08/15/2018  . Acute on chronic systolic (congestive) heart failure ([facility]) 08/15/2018    Orientation RESPIRATION BLADDER Height & Weight     Self  Normal Incontinent Weight: 63.8 kg Height:  5\' 6"  (167.6 cm)  BEHAVIORAL SYMPTOMS/MOOD NEUROLOGICAL BOWEL NUTRITION STATUS      Continent Diet(Please see DC Summary)  AMBULATORY STATUS COMMUNICATION OF NEEDS Skin   Limited Assist Verbally Normal                       Personal Care Assistance Level of Assistance  Bathing, Feeding, Dressing Bathing Assistance: Limited assistance Feeding assistance: Limited assistance Dressing Assistance: Limited assistance     Functional Limitations Info  Sight, Hearing, Speech Sight Info: Adequate Hearing Info: Adequate Speech Info: Adequate    SPECIAL CARE FACTORS FREQUENCY  PT (By licensed PT), OT (By licensed OT)     PT Frequency: 5x/week OT Frequency: 3x/week            Contractures Contractures Info: Not present    Additional Factors Info  Code Status, Allergies, Insulin Sliding Scale Code Status Info: DNR Allergies Info: Codeine   Insulin Sliding Scale  Info: 3x daily with meals and at bedtime       Current Medications (08/18/2018):  This is the current hospital active medication list Current Facility-Administered Medications  Medication Dose Route Frequency Provider Last Rate Last Dose  . 0.9 %  sodium chloride infusion   Intravenous Continuous Troy Sine, MD 20 mL/hr at 08/17/18 0630    . 0.9 %  sodium chloride infusion  250 mL Intravenous PRN Troy Sine, MD      . acetaminophen (TYLENOL) tablet 650 mg  650 mg Oral Q4H PRN Chakravartti, Jaidip, MD      . aspirin EC tablet 81 mg  81 mg Oral Daily Chakravartti, Jaidip, MD   81 mg at 08/18/18 0807  . carvedilol (COREG) tablet 3.125 mg  3.125 mg Oral BID WC Pixie Casino, MD   3.125 mg at 08/18/18 1043  . clopidogrel (PLAVIX) tablet 75 mg  75 mg Oral Daily Chakravartti, Jaidip, MD   75 mg at 08/18/18 0805  . diazepam (VALIUM) tablet 2 mg  2 mg Oral Q6H PRN Troy Sine, MD      . donepezil (ARICEPT) tablet 10 mg  10 mg Oral QHS Chakravartti, Jaidip, MD   10 mg at 08/17/18 2249  . heparin injection 5,000 Units  5,000 Units Subcutaneous Q8H Troy Sine, MD   5,000 Units at 08/18/18 0530  . insulin aspart (novoLOG) injection 0-5 Units  0-5 Units Subcutaneous QHS Doylene Canning, MD   3 Units at 08/16/18 2255  .  insulin aspart (novoLOG) injection 0-9 Units  0-9 Units Subcutaneous TID WC Chakravartti, Jaidip, MD   2 Units at 08/18/18 1203  . insulin glargine (LANTUS) injection 10 Units  10 Units Subcutaneous QHS Doylene Canning, MD   10 Units at 08/17/18 2247  . MEDLINE mouth rinse  15 mL Mouth Rinse BID Minus Breeding, MD   15 mL at 08/18/18 7001  . nitroGLYCERIN (NITROSTAT) SL tablet 0.4 mg  0.4 mg Sublingual Q5 Min x 3 PRN Chakravartti, Jaidip, MD      . ondansetron (ZOFRAN) injection 4 mg  4 mg Intravenous Q6H PRN Chakravartti, Jaidip, MD      . pantoprazole (PROTONIX) EC tablet 40 mg  40 mg Oral Daily Chakravartti, Jaidip, MD   40 mg at 08/18/18 0806  . potassium  chloride SA (K-DUR,KLOR-CON) CR tablet 20 mEq  20 mEq Oral BID Nahser, Wonda Cheng, MD   20 mEq at 08/18/18 0807  . rosuvastatin (CRESTOR) tablet 10 mg  10 mg Oral q1800 Chakravartti, Jaidip, MD   10 mg at 08/17/18 1700  . sacubitril-valsartan (ENTRESTO) 24-26 mg per tablet  1 tablet Oral BID Melina Copa N, PA-C   1 tablet at 08/17/18 1152  . sodium chloride flush (NS) 0.9 % injection 3 mL  3 mL Intravenous Q12H Troy Sine, MD   3 mL at 08/18/18 0808  . sodium chloride flush (NS) 0.9 % injection 3 mL  3 mL Intravenous PRN Troy Sine, MD         Discharge Medications: Please see discharge summary for a list of discharge medications.  Relevant Imaging Results:  Relevant Lab Results:   Additional Information SSN: [place]  [place], [place]

## 2018-08-19 ENCOUNTER — Encounter (HOSPITAL_COMMUNITY): Payer: Self-pay | Admitting: Cardiovascular Disease

## 2018-08-19 DIAGNOSIS — I428 Other cardiomyopathies: Secondary | ICD-10-CM

## 2018-08-19 LAB — GLUCOSE, CAPILLARY
GLUCOSE-CAPILLARY: 150 mg/dL — AB (ref 70–99)
GLUCOSE-CAPILLARY: 147 mg/dL — AB (ref 70–99)
GLUCOSE-CAPILLARY: 179 mg/dL — AB (ref 70–99)
Glucose-Capillary: 192 mg/dL — ABNORMAL HIGH (ref 70–99)

## 2018-08-19 MED ORDER — LOSARTAN POTASSIUM 25 MG PO TABS
25.0000 mg | ORAL_TABLET | Freq: Every day | ORAL | Status: DC
  Administered 2018-08-19 – 2018-08-22 (×4): 25 mg via ORAL
  Filled 2018-08-19 (×4): qty 1

## 2018-08-19 MED ORDER — MAGNESIUM HYDROXIDE 400 MG/5ML PO SUSP
30.0000 mL | Freq: Every day | ORAL | Status: DC | PRN
  Administered 2018-08-19 – 2018-08-20 (×3): 30 mL via ORAL
  Filled 2018-08-19 (×2): qty 30

## 2018-08-19 NOTE — Evaluation (Signed)
Occupational Therapy Evaluation Patient Details Name: Drew Weber MRN: [id number] DOB: [birth year] Today's Date: 08/19/2018    History of Present Illness Pt is a 83 y.o. M with significant PMH of CVA, CAD, and PAF who presents with acute decompensated heart failure. Cath showed moderate non obstructive CAD - LVEF reportedly reduced to 15%.   Clinical Impression   Pt is assisted to set up for ADL and transfer to shower. He walks with a cane. Pt presents with baseline impairment in cognition, decreased standing balance with tendency to sit abruptly and requires set up to max assist for ADL. Pt will need post acute rehab in SNF prior to return home and is agreeable to this plan. Will follow acutely.    Follow Up Recommendations  SNF;Supervision/Assistance - 24 hour    Equipment Recommendations  3 in 1 bedside commode    Recommendations for Other Services       Precautions / Restrictions Precautions Precautions: Fall Precaution Comments: sits abruptly Restrictions Weight Bearing Restrictions: No      Mobility Bed Mobility Overal bed mobility: Modified Independent             General bed mobility comments: from relatively flat bed, no difficulty or use of rail  Transfers Overall transfer level: Needs assistance Equipment used: Rolling walker (2 wheeled) Transfers: Sit to/from Stand Sit to Stand: Min assist         General transfer comment: assist to rise and steady, sit abruptly    Balance Overall balance assessment: Needs assistance   Sitting balance-Leahy Scale: Good       Standing balance-Leahy Scale: Poor Standing balance comment: reliant on external support                           ADL either performed or assessed with clinical judgement   ADL Overall ADL's : Needs assistance/impaired Eating/Feeding: Set up;Sitting   Grooming: Wash/dry hands;Wash/dry face;Sitting;Set up   Upper Body Bathing: Minimal assistance;Sitting   Lower  Body Bathing: Maximal assistance;Sit to/from stand   Upper Body Dressing : Minimal assistance;Sitting   Lower Body Dressing: Maximal assistance;Sit to/from stand   Toilet Transfer: Minimal assistance;Stand-pivot;BSC;RW   Toileting- Clothing Manipulation and Hygiene: Maximal assistance;Sit to/from stand               Vision Patient Visual Report: No change from baseline       Perception     Praxis      Pertinent Vitals/Pain Pain Assessment: No/denies pain     Hand Dominance Right   Extremity/Trunk Assessment Upper Extremity Assessment Upper Extremity Assessment: Overall WFL for tasks assessed   Lower Extremity Assessment Lower Extremity Assessment: Generalized weakness   Cervical / Trunk Assessment Cervical / Trunk Assessment: Kyphotic   Communication Communication Communication: HOH   Cognition Arousal/Alertness: Awake/alert Behavior During Therapy: WFL for tasks assessed/performed Overall Cognitive Status: History of cognitive impairments - at baseline                                 General Comments: hx of dementia, very pleasant   General Comments       Exercises     Shoulder Instructions      Home Living Family/patient expects to be discharged to:: Private residence Living Arrangements: Spouse/significant other;Children(wife and 2 sons) Available Help at Discharge: Family;Available 24 hours/day Type of Home: House Home Access: Stairs to enter [employer]  of Steps: 1   Home Layout: Able to live on main level with bedroom/bathroom     Bathroom Shower/Tub: Walk-in shower         Home Equipment: Kasandra Knudsen - single point          Prior Functioning/Environment Level of Independence: Needs assistance  Gait / Transfers Assistance Needed: ambulates with cane. pt wife reports history of 2 falls recently ADL's / Homemaking Assistance Needed: wife assists husband into shower and set up for ADL's, wife does all IADL             OT Problem List: Decreased strength;Decreased activity tolerance;Impaired balance (sitting and/or standing);Decreased cognition;Decreased safety awareness;Decreased knowledge of use of DME or AE      OT Treatment/Interventions: Self-care/ADL training;DME and/or AE instruction;Patient/family education;Balance training    OT Goals(Current goals can be found in the care plan section) Acute Rehab OT Goals Patient Stated Goal: to get stronger OT Goal Formulation: With patient Time For Goal Achievement: 09/02/18 Potential to Achieve Goals: Good ADL Goals Pt Will Perform Grooming: standing;with min guard assist Pt Will Perform Upper Body Dressing: with set-up;sitting Pt Will Perform Lower Body Dressing: with min assist;sit to/from stand Pt Will Transfer to Toilet: with min guard assist;ambulating Pt Will Perform Toileting - Clothing Manipulation and hygiene: with min guard assist;sit to/from stand  OT Frequency: Min 2X/week   Barriers to D/C:            Co-evaluation              AM-PAC OT "6 Clicks" Daily Activity     Outcome Measure Help from another person eating meals?: A Little Help from another person taking care of personal grooming?: A Little Help from another person toileting, which includes using toliet, bedpan, or urinal?: A Lot Help from another person bathing (including washing, rinsing, drying)?: A Lot Help from another person to put on and taking off regular upper body clothing?: A Little Help from another person to put on and taking off regular lower body clothing?: A Lot 6 Click Score: 15   End of Session Equipment Utilized During Treatment: Gait belt;Rolling walker Nurse Communication: Mobility status  Activity Tolerance: Patient tolerated treatment well Patient left: in chair;with call bell/phone within reach;with chair alarm set;with nursing/sitter in room;with family/visitor present  OT Visit Diagnosis: Unsteadiness on feet (R26.81);Other  abnormalities of gait and mobility (R26.89);Muscle weakness (generalized) (M62.81);Other symptoms and signs involving cognitive function;History of falling (Z91.81)                Time: 3149-7026 OT Time Calculation (min): 18 min Charges:  OT General Charges $OT Visit: 1 Visit OT Evaluation $OT Eval Moderate Complexity: 1 Mod  Nestor Lewandowsky, OTR/L Acute Rehabilitation Services Pager: [phone] Office: [phone]  Malka So 08/19/2018, 11:40 AM

## 2018-08-19 NOTE — Clinical Social Work Note (Signed)
Clinical Social Work Assessment  Patient Details  Name: Drew Weber MRN: [id number] Date of Birth: Nov 12, 1934  Date of referral:  08/19/18               Reason for consult:  Facility Placement, Discharge Planning                Permission sought to share information with:  Facility Sport and exercise psychologist, Family Supports Permission granted to share information::  Yes, Verbal Permission Granted  Name::     [place]::  SNFs  Relationship::  spouse  Contact Information:  [phone]  Housing/Transportation Living arrangements for the past 2 months:  Single Family Home Source of Information:  Spouse Patient Interpreter Needed:  None Criminal Activity/Legal Involvement Pertinent to Current Situation/Hospitalization:  No - Comment as needed Significant Relationships:  Spouse, Other Family Members Lives with:  Spouse Do you feel safe going back to the place where you live?  Yes Need for family participation in patient care:  Yes (Comment)  Care giving concerns: Patient from home with spouse. PT recommending SNF.    Social Worker assessment / plan: CSW met with patient and spouse at bedside. Patient only oriented to self. CSW introduced self and role and discussed disposition planning. Spouse would like for patient to go to [facility] for rehab.   CSW confirmed patient's bed at [facility]. [facility] has started patient's [facility] authorization, determination pending. Patient requires auth before admitting to the facility. CSW to follow and support with discharge when Josem Kaufmann is received.  Employment status:  Retired Research officer, political party) PT Recommendations:  [place] / Referral to community resources:  [place]  Patient/Family's Response to care: Family appreciative of care.  Patient/Family's Understanding of and Emotional Response to Diagnosis, Current Treatment, and Prognosis: Family with good understanding  of patient's condition and care needs. Agreeable to SNF.  Emotional Assessment Appearance:  Appears stated age Attitude/Demeanor/Rapport:  Other(alert but not engaged) Affect (typically observed):  Unable to Assess Orientation:  Oriented to Self Alcohol / Substance use:  Not Applicable Psych involvement (Current and /or in the community):  No (Comment)  Discharge Needs  Concerns to be addressed:  Care Coordination, Discharge Planning Concerns Readmission within the last 30 days:  No Current discharge risk:  Physical Impairment, Cognitively Impaired Barriers to Discharge:  Continued Medical Work up, [place], LCSW 08/19/2018, 12:04 PM

## 2018-08-19 NOTE — Progress Notes (Signed)
DAILY PROGRESS NOTE   Patient Name: Drew Weber [facility] Date of Encounter: 08/19/2018  Primary Cardiologist :  Dr. Otho Perl [facility])   Chief Complaint   Patient is doing well today.  He has no complaints.  Patient Profile   83 year old man with chronic systolic heart failure the setting of coronary artery disease who presents with acute decompensated heart failure.  Subjective   Heart catheterization several days ago reveals nonobstructive coronary artery disease.  He has a nonobstructive cardiomyopathy with an ejection fraction of around 15%.Marland Kitchen  Has been evaluated by physical therapy and it was her opinion that he needs skilled nursing facility.  Objective   Vitals:   08/18/18 1952 08/18/18 2122 08/18/18 2335 08/19/18 0524  BP: 96/61 (!) 97/57 (!) 90/55 (!) 95/52  Pulse: 86 92 78 84  Resp:   11 (!) 22  Temp: (!) 97.5 F (36.4 C)  98 F (36.7 C) (!) 97.3 F (36.3 C)  TempSrc: Oral  Oral Oral  SpO2: 96%  93% 95%  Weight:    61.1 kg  Height:        Intake/Output Summary (Last 24 hours) at 08/19/2018 0941 Last data filed at 08/18/2018 2338 Gross per 24 hour  Intake 603 ml  Output 250 ml  Net 353 ml   Filed Weights   08/17/18 0555 08/18/18 0417 08/19/18 0524  Weight: 62.5 kg 63.8 kg 61.1 kg    Physical Exam   Physical Exam: Blood pressure (!) 95/52, pulse 84, temperature (!) 97.3 F (36.3 C), temperature source Oral, resp. rate (!) 22, height 5\' 6"  (1.676 m), weight 61.1 kg, SpO2 95 %.  GEN:   Elderly , pale man.  NAD  HEENT: Normal NECK: No JVD; No carotid bruits LYMPHATICS: No lymphadenopathy CARDIAC:  RR  RESPIRATORY:  Clear to auscultation without rales, wheezing or rhonchi  ABDOMEN: Soft, non-tender, non-distended MUSCULOSKELETAL:  No edema; No deformity  SKIN: Warm and dry NEUROLOGIC:  Alert and oriented x 3   Inpatient Medications    Scheduled Meds: . aspirin EC  81 mg Oral Daily  . carvedilol  3.125 mg Oral BID WC  . clopidogrel  75 mg Oral  Daily  . donepezil  10 mg Oral QHS  . heparin  5,000 Units Subcutaneous Q8H  . insulin aspart  0-5 Units Subcutaneous QHS  . insulin aspart  0-9 Units Subcutaneous TID WC  . insulin glargine  10 Units Subcutaneous QHS  . mouth rinse  15 mL Mouth Rinse BID  . pantoprazole  40 mg Oral Daily  . potassium chloride  20 mEq Oral BID  . rosuvastatin  10 mg Oral q1800  . sacubitril-valsartan  1 tablet Oral BID  . sodium chloride flush  3 mL Intravenous Q12H    Continuous Infusions: . sodium chloride 20 mL/hr at 08/17/18 0630  . sodium chloride      PRN Meds: sodium chloride, acetaminophen, diazepam, nitroGLYCERIN, ondansetron (ZOFRAN) IV, sodium chloride flush   Labs   Results for orders placed or performed during the hospital encounter of 08/15/18 (from the past 48 hour(s))  POCT Activated clotting time     Status: None   Collection Time: 08/17/18  9:45 AM  Result Value Ref Range   Activated Clotting Time 114 seconds  Glucose, capillary     Status: Abnormal   Collection Time: 08/17/18 10:07 AM  Result Value Ref Range   Glucose-Capillary 151 (H) 70 - 99 mg/dL  Glucose, capillary     Status: Abnormal  Collection Time: 08/17/18 11:30 AM  Result Value Ref Range   Glucose-Capillary 164 (H) 70 - 99 mg/dL  CBC     Status: Abnormal   Collection Time: 08/17/18 11:54 AM  Result Value Ref Range   WBC 7.0 4.0 - 10.5 K/uL   RBC 3.29 (L) 4.22 - 5.81 MIL/uL   Hemoglobin 10.0 (L) 13.0 - 17.0 g/dL   HCT 31.3 (L) 39.0 - 52.0 %   MCV 95.1 80.0 - 100.0 fL   MCH 30.4 26.0 - 34.0 pg   MCHC 31.9 30.0 - 36.0 g/dL   RDW 14.3 11.5 - 15.5 %   Platelets 271 150 - 400 K/uL   nRBC 0.0 0.0 - 0.2 %    Comment: Performed at [facility], [place] [address] [place], [place] [address]  Creatinine, serum     Status: Abnormal   Collection Time: 08/17/18 11:54 AM  Result Value Ref Range   Creatinine, Ser 1.29 (H) 0.61 - 1.24 mg/dL   GFR calc non Af Amer 52 (L) >60 mL/min   GFR calc Af Amer 60 (L) >60  mL/min    Comment: Performed at [facility] [address] [place], [place] [address]  Glucose, capillary     Status: Abnormal   Collection Time: 08/17/18  3:57 PM  Result Value Ref Range   Glucose-Capillary 150 (H) 70 - 99 mg/dL  Glucose, capillary     Status: Abnormal   Collection Time: 08/17/18  9:47 PM  Result Value Ref Range   Glucose-Capillary 154 (H) 70 - 99 mg/dL  CBC     Status: Abnormal   Collection Time: 08/18/18  4:00 AM  Result Value Ref Range   WBC 8.9 4.0 - 10.5 K/uL   RBC 3.23 (L) 4.22 - 5.81 MIL/uL   Hemoglobin 9.8 (L) 13.0 - 17.0 g/dL   HCT 30.8 (L) 39.0 - 52.0 %   MCV 95.4 80.0 - 100.0 fL   MCH 30.3 26.0 - 34.0 pg   MCHC 31.8 30.0 - 36.0 g/dL   RDW 14.4 11.5 - 15.5 %   Platelets 291 150 - 400 K/uL   nRBC 0.0 0.0 - 0.2 %    Comment: Performed at [facility], [place] [address], [place], [place] [address]  Basic metabolic panel     Status: Abnormal   Collection Time: 08/18/18  4:00 AM  Result Value Ref Range   Sodium 140 135 - 145 mmol/L   Potassium 4.0 3.5 - 5.1 mmol/L   Chloride 101 98 - 111 mmol/L   CO2 25 22 - 32 mmol/L   Glucose, Bld 165 (H) 70 - 99 mg/dL   BUN 25 (H) 8 - 23 mg/dL   Creatinine, Ser 1.47 (H) 0.61 - 1.24 mg/dL   Calcium 8.8 (L) 8.9 - 10.3 mg/dL   GFR calc non Af Amer 44 (L) >60 mL/min   GFR calc Af Amer 51 (L) >60 mL/min   Anion gap 14 5 - 15    Comment: Performed at [facility] [address], [place], [place] [address]  Glucose, capillary     Status: Abnormal   Collection Time: 08/18/18  7:39 AM  Result Value Ref Range   Glucose-Capillary 156 (H) 70 - 99 mg/dL  Glucose, capillary     Status: Abnormal   Collection Time: 08/18/18 11:49 AM  Result Value Ref Range   Glucose-Capillary 157 (H) 70 - 99 mg/dL  Glucose, capillary     Status:  Abnormal   Collection Time: 08/18/18  4:20 PM  Result Value Ref Range   Glucose-Capillary 205 (H) 70 - 99 mg/dL  Glucose, capillary     Status: Abnormal   Collection Time:  08/18/18  9:29 PM  Result Value Ref Range   Glucose-Capillary 175 (H) 70 - 99 mg/dL  Glucose, capillary     Status: Abnormal   Collection Time: 08/19/18  7:42 AM  Result Value Ref Range   Glucose-Capillary 150 (H) 70 - 99 mg/dL    ECG   N/A  Telemetry   Sinus rhythm  Radiology    No results found.  Cardiac Studies   RIGHT/LEFT HEART CATH AND CORONARY ANGIOGRAPHY  Conclusion     Ost LAD to Prox LAD lesion is 10% stenosed.  Prox LAD to Mid LAD lesion is 5% stenosed.  Ost 1st Diag to 1st Diag lesion is 5% stenosed.  2nd Mrg lesion is 70% stenosed.  Dist RCA-1 lesion is 20% stenosed.  Dist RCA-2 lesion is 20% stenosed.   Moderate coronary obstructive disease with mild calcification of the proximal LAD with widely patent stents in the diagonal and mid LAD; normal ramus intermediate; left circumflex vessel with focal 70% stenosis in a small marginal branch; dominant RCA with mild 20% tandem distal stenoses prior to the PDA takeoff.  Low right heart pressures with initial PW mean at 4.  His recent echo documentation of EF of 50%, findings are consistent with HFrEF with nonischemic cardiomyopathy.  RECOMMENDATION: Guideline directed medical therapy for acute on chronic combined heart failure.  Medical therapy for CAD. Patient has been on chronic dual antiplatelet therapy.        Assessment/ Plan    1.  Chronic systolic congestive heart failure: The patient has a nonischemic cardiomyopathy.  Heart catheterization several days ago reveals nonobstructive coronary disease. He is not in any acute heart failure at this time.  We had started him on Entresto but in fact he has not been able to receive the [facility] because of hypotension. Changing to losartan 25 mg a day.  Physical therapist has recommended that he be placed in a skilled nursing facility.  His family will discuss with a Education officer, museum today.  Placement will not be possible today but hopefully on  Monday.    Mertie Moores, MD  08/19/2018 9:48 AM    [place],  [place], [place]  [address] Pager [phone] Phone: [phone]; Fax: [phone]

## 2018-08-19 NOTE — Progress Notes (Signed)
Pt had 6 beats of VTACH. He was sleeping. Patient has periodically had more beats that is documented. Pt stable, alert. Family at bedside. Will closely monitor

## 2018-08-20 LAB — GLUCOSE, CAPILLARY
GLUCOSE-CAPILLARY: 114 mg/dL — AB (ref 70–99)
GLUCOSE-CAPILLARY: 163 mg/dL — AB (ref 70–99)
Glucose-Capillary: 183 mg/dL — ABNORMAL HIGH (ref 70–99)
Glucose-Capillary: 144 mg/dL — ABNORMAL HIGH (ref 70–99)

## 2018-08-20 MED ORDER — CARVEDILOL 6.25 MG PO TABS
6.2500 mg | ORAL_TABLET | Freq: Two times a day (BID) | ORAL | Status: DC
  Administered 2018-08-20 – 2018-08-22 (×5): 6.25 mg via ORAL
  Filled 2018-08-20 (×5): qty 1

## 2018-08-20 MED ORDER — POTASSIUM CHLORIDE CRYS ER 20 MEQ PO TBCR: 20.0000 meq | EXTENDED_RELEASE_TABLET | Freq: Every day | ORAL | Status: DC

## 2018-08-20 NOTE — Progress Notes (Signed)
Progress Note  Patient Name: Drew Weber [facility] Date of Encounter: 08/20/2018  Primary Cardiologist: Dr. Abran Richard [facility])  Subjective   No specific complaints today.  I spoke with the patient's granddaughter had no concerns.  Inpatient Medications    Scheduled Meds: . aspirin EC  81 mg Oral Daily  . carvedilol  3.125 mg Oral BID WC  . clopidogrel  75 mg Oral Daily  . donepezil  10 mg Oral QHS  . heparin  5,000 Units Subcutaneous Q8H  . insulin aspart  0-5 Units Subcutaneous QHS  . insulin aspart  0-9 Units Subcutaneous TID WC  . insulin glargine  10 Units Subcutaneous QHS  . losartan  25 mg Oral Daily  . mouth rinse  15 mL Mouth Rinse BID  . pantoprazole  40 mg Oral Daily  . potassium chloride  20 mEq Oral BID  . rosuvastatin  10 mg Oral q1800  . sodium chloride flush  3 mL Intravenous Q12H   Continuous Infusions: . sodium chloride 20 mL/hr at 08/17/18 0630  . sodium chloride     PRN Meds: sodium chloride, acetaminophen, diazepam, magnesium hydroxide, nitroGLYCERIN, ondansetron (ZOFRAN) IV, sodium chloride flush   Vital Signs    Vitals:   08/19/18 2153 08/20/18 0039 08/20/18 0521 08/20/18 0853  BP: 104/60 (!) 99/53 (!) 104/57 105/69  Pulse: 83 77 81 84  Resp:   15   Temp:  97.9 F (36.6 C) 97.7 F (36.5 C) (!) 97.5 F (36.4 C)  TempSrc:  Oral Oral Oral  SpO2:  96% 97% 97%  Weight:   62 kg   Height:        Intake/Output Summary (Last 24 hours) at 08/20/2018 0929 Last data filed at 08/19/2018 2156 Gross per 24 hour  Intake 806 ml  Output 1075 ml  Net -269 ml   Filed Weights   08/18/18 0417 08/19/18 0524 08/20/18 0521  Weight: 63.8 kg 61.1 kg 62 kg    Telemetry    Rhythm, brief burst of NSVT.  Personally reviewed.  Physical Exam   GEN:  Elderly male.  No acute distress.   Neck: No JVD. Cardiac: RRR, no gallop.  Respiratory: Nonlabored. Clear to auscultation bilaterally. GI: Soft, nontender, bowel sounds present. MS: No edema; No  deformity.  Labs    Chemistry Recent Labs  Lab 08/15/18 2312 08/16/18 0432 08/16/18 1055 08/17/18 1154 08/18/18 0400  NA 139  --  139  --  140  K 2.7* 3.2* 4.0  --  4.0  CL 104  --  102  --  101  CO2 27  --  27  --  25  GLUCOSE 263*  --  208*  --  165*  BUN 25*  --  24*  --  25*  CREATININE 1.23  --  1.24 1.29* 1.47*  CALCIUM 8.3*  --  8.4*  --  8.8*  GFRNONAA 53*  --  54* 52* 44*  GFRAA >60  --  >60 60* 51*  ANIONGAP 8  --  10  --  14     Hematology Recent Labs  Lab 08/17/18 0422 08/17/18 1154 08/18/18 0400  WBC 7.2 7.0 8.9  RBC 3.04* 3.29* 3.23*  HGB 9.3* 10.0* 9.8*  HCT 29.4* 31.3* 30.8*  MCV 96.7 95.1 95.4  MCH 30.6 30.4 30.3  MCHC 31.6 31.9 31.8  RDW 14.3 14.3 14.4  PLT 255 271 291    Cardiac Enzymes Recent Labs  Lab 08/16/18 1055  TROPONINI 0.77*  No results for input(s): TROPIPOC in the last 168 hours.   Radiology    No results found.  Cardiac Studies    Echocardiogram 08/17/2018:  Ost LAD to Prox LAD lesion is 10% stenosed.  Prox LAD to Mid LAD lesion is 5% stenosed.  Ost 1st Diag to 1st Diag lesion is 5% stenosed.  2nd Mrg lesion is 70% stenosed.  Dist RCA-1 lesion is 20% stenosed.  Dist RCA-2 lesion is 20% stenosed.   Moderate coronary obstructive disease with mild calcification of the proximal LAD with widely patent stents in the diagonal and mid LAD; normal ramus intermediate; left circumflex vessel with focal 70% stenosis in a small marginal branch; dominant RCA with mild 20% tandem distal stenoses prior to the PDA takeoff.  Low right heart pressures with initial PW mean at 4.  Patient Profile     83 y.o. male with a history of type 2 diabetes mellitus, previous stroke, dementia, nonischemic cardiomyopathy, and moderate nonobstructive CAD.  He is being managed for acute on chronic systolic heart failure.  Assessment & Plan    1.  Acute on chronic systolic heart failure.  2.  Nonischemic cardiomyopathy with LVEF recently  reported at less than 15% based on evaluation in .  Plan is for medical therapy.  3.  Moderate, nonobstructive CAD by recent cardiac catheterization.  Obvious angina symptoms.  4.  Dementia.  5.  Renal insufficiency, creatinine has increased to 1.29 up to 1.47, last checked on November 28.  Currently on low-dose Cozaar.  Patient has been evaluated by physical therapy and social work.  Plan is for discharge to [facility], likely weekly first of the week.  Continue aspirin and Plavix as well as statin therapy.  Increase Coreg to 6.25 mg twice daily.  Follow-up BMET, may need to reduce or discontinue Cozaar if creatinine continues to increase.  Also reduce potassium and likely discontinue in the absence of diuretic therapy at baseline.  Signed, Rozann Lesches, MD  08/20/2018, 9:29 AM

## 2018-08-20 NOTE — Plan of Care (Signed)
Heparin SQ for VTE

## 2018-08-21 LAB — GLUCOSE, CAPILLARY
Glucose-Capillary: 150 mg/dL — ABNORMAL HIGH (ref 70–99)
Glucose-Capillary: 183 mg/dL — ABNORMAL HIGH (ref 70–99)
Glucose-Capillary: 142 mg/dL — ABNORMAL HIGH (ref 70–99)
Glucose-Capillary: 183 mg/dL — ABNORMAL HIGH (ref 70–99)

## 2018-08-21 LAB — BASIC METABOLIC PANEL
ANION GAP: 10 (ref 5–15)
BUN: 35 mg/dL — ABNORMAL HIGH (ref 8–23)
CHLORIDE: 102 mmol/L (ref 98–111)
CO2: 22 mmol/L (ref 22–32)
Calcium: 8.2 mg/dL — ABNORMAL LOW (ref 8.9–10.3)
Creatinine, Ser: 1.47 mg/dL — ABNORMAL HIGH (ref 0.61–1.24)
GFR calc Af Amer: 51 mL/min — ABNORMAL LOW (ref >60)
GFR, EST NON AFRICAN AMERICAN: 44 mL/min — AB (ref >60)
GLUCOSE: 153 mg/dL — AB (ref 70–99)
POTASSIUM: 5.5 mmol/L — AB (ref 3.5–5.1)
SODIUM: 134 mmol/L — AB (ref 135–145)

## 2018-08-21 NOTE — Progress Notes (Signed)
Progress Note  Patient Name: Drew Weber Date of Encounter: 08/21/2018  Primary Cardiologist: Dr. Abran Richard [facility])  Subjective   No chest pain or shortness of breath.  Inpatient Medications    Scheduled Meds: . aspirin EC  81 mg Oral Daily  . carvedilol  6.25 mg Oral BID WC  . clopidogrel  75 mg Oral Daily  . donepezil  10 mg Oral QHS  . heparin  5,000 Units Subcutaneous Q8H  . insulin aspart  0-5 Units Subcutaneous QHS  . insulin aspart  0-9 Units Subcutaneous TID WC  . insulin glargine  10 Units Subcutaneous QHS  . losartan  25 mg Oral Daily  . mouth rinse  15 mL Mouth Rinse BID  . pantoprazole  40 mg Oral Daily  . potassium chloride  20 mEq Oral Daily  . rosuvastatin  10 mg Oral q1800  . sodium chloride flush  3 mL Intravenous Q12H   Continuous Infusions: . sodium chloride 20 mL/hr at 08/17/18 0630  . sodium chloride     PRN Meds: sodium chloride, acetaminophen, diazepam, magnesium hydroxide, nitroGLYCERIN, ondansetron (ZOFRAN) IV, sodium chloride flush   Vital Signs    Vitals:   08/20/18 2343 08/21/18 0017 08/21/18 0410 08/21/18 0717  BP: (!) 93/49 (!) 99/56 (!) 98/57 106/66  Pulse: 73  79 62  Resp: (!) 22 19 17 19   Temp: 98 F (36.7 C)  97.7 F (36.5 C) (!) 97.4 F (36.3 C)  TempSrc: Oral  Oral Oral  SpO2: 93%  97% 100%  Weight:      Height:        Intake/Output Summary (Last 24 hours) at 08/21/2018 0923 Last data filed at 08/21/2018 0400 Gross per 24 hour  Intake 650 ml  Output 1100 ml  Net -450 ml   Filed Weights   08/18/18 0417 08/19/18 0524 08/20/18 0521  Weight: 63.8 kg 61.1 kg 62 kg    Telemetry    Sinus rhythm with continued ventricular ectopy.  Personally reviewed.  Physical Exam   GEN:  Elderly male.  No acute distress.   Neck: No JVD. Cardiac: RRR, no murmur, rub, or gallop.  Respiratory: Nonlabored. Clear to auscultation bilaterally. GI: Soft, nontender, bowel sounds present. MS: No edema; No deformity.  Labs    Chemistry Recent Labs  Lab 08/16/18 1055 08/17/18 1154 08/18/18 0400 08/21/18 0434  NA 139  --  140 134*  K 4.0  --  4.0 5.5*  CL 102  --  101 102  CO2 27  --  25 22  GLUCOSE 208*  --  165* 153*  BUN 24*  --  25* 35*  CREATININE 1.24 1.29* 1.47* 1.47*  CALCIUM 8.4*  --  8.8* 8.2*  GFRNONAA 54* 52* 44* 44*  GFRAA >60 60* 51* 51*  ANIONGAP 10  --  14 10     Hematology Recent Labs  Lab 08/17/18 0422 08/17/18 1154 08/18/18 0400  WBC 7.2 7.0 8.9  RBC 3.04* 3.29* 3.23*  HGB 9.3* 10.0* 9.8*  HCT 29.4* 31.3* 30.8*  MCV 96.7 95.1 95.4  MCH 30.6 30.4 30.3  MCHC 31.6 31.9 31.8  RDW 14.3 14.3 14.4  PLT 255 271 291    Cardiac Enzymes Recent Labs  Lab 08/16/18 1055  TROPONINI 0.77*   No results for input(s): TROPIPOC in the last 168 hours.    Radiology    No results found.  Cardiac Studies   Echocardiogram 08/17/2018:  Ost LAD to Prox LAD lesion is 10%  stenosed.  Prox LAD to Mid LAD lesion is 5% stenosed.  Ost 1st Diag to 1st Diag lesion is 5% stenosed.  2nd Mrg lesion is 70% stenosed.  Dist RCA-1 lesion is 20% stenosed.  Dist RCA-2 lesion is 20% stenosed.  Moderate coronary obstructive disease with mild calcification of the proximal LAD with widely patent stents in the diagonal and mid LAD; normal ramus intermediate; left circumflex vessel with focal 70% stenosis in a small marginal branch; dominant RCA with mild 20% tandem distal stenoses prior to the PDA takeoff.  Low right heart pressures with initial PW mean at 4.  Patient Profile     83 y.o. male with a history of type 2 diabetes mellitus, previous stroke, dementia, nonischemic cardiomyopathy, and moderate nonobstructive CAD.  He is being managed for acute on chronic systolic heart failure.  Assessment & Plan    1.  Acute on chronic systolic heart failure.  2.  Nonischemic cardiomyopathy with LVEF less than 15% based on evaluation in [place].  Medical therapy is planned.  3.  Moderate  nonobstructive CAD recent cardiac catheterization.  No active angina symptoms.  4.  Dementia.  5.  Acute on chronic renal insufficiency, creatinine stable at 1.47 with mild hyperkalemia.  Plan is for discharge to [facility], likely first of the week.  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  From a cardiac perspective we will continue aspirin, Plavix, and statin.  Coreg dose was increased yesterday, continue same for now.  Stop potassium supplements.  Continue Cozaar if renal function does not deteriorate further.  Signed, Rozann Lesches, MD  08/21/2018, 9:23 AM

## 2018-08-22 DIAGNOSIS — R7989 Other specified abnormal findings of blood chemistry: Secondary | ICD-10-CM | POA: Diagnosis not present

## 2018-08-22 DIAGNOSIS — I110 Hypertensive heart disease with heart failure: Secondary | ICD-10-CM | POA: Diagnosis not present

## 2018-08-22 DIAGNOSIS — E119 Type 2 diabetes mellitus without complications: Secondary | ICD-10-CM | POA: Diagnosis not present

## 2018-08-22 DIAGNOSIS — I428 Other cardiomyopathies: Secondary | ICD-10-CM | POA: Diagnosis not present

## 2018-08-22 DIAGNOSIS — I2510 Atherosclerotic heart disease of native coronary artery without angina pectoris: Secondary | ICD-10-CM | POA: Diagnosis not present

## 2018-08-22 DIAGNOSIS — R262 Difficulty in walking, not elsewhere classified: Secondary | ICD-10-CM | POA: Diagnosis not present

## 2018-08-22 DIAGNOSIS — I420 Dilated cardiomyopathy: Secondary | ICD-10-CM | POA: Diagnosis not present

## 2018-08-22 DIAGNOSIS — D649 Anemia, unspecified: Secondary | ICD-10-CM | POA: Diagnosis not present

## 2018-08-22 DIAGNOSIS — Z955 Presence of coronary angioplasty implant and graft: Secondary | ICD-10-CM | POA: Diagnosis not present

## 2018-08-22 DIAGNOSIS — K219 Gastro-esophageal reflux disease without esophagitis: Secondary | ICD-10-CM | POA: Diagnosis not present

## 2018-08-22 DIAGNOSIS — E785 Hyperlipidemia, unspecified: Secondary | ICD-10-CM | POA: Diagnosis not present

## 2018-08-22 DIAGNOSIS — I10 Essential (primary) hypertension: Secondary | ICD-10-CM | POA: Diagnosis not present

## 2018-08-22 DIAGNOSIS — A419 Sepsis, unspecified organism: Secondary | ICD-10-CM | POA: Diagnosis not present

## 2018-08-22 DIAGNOSIS — I5023 Acute on chronic systolic (congestive) heart failure: Secondary | ICD-10-CM | POA: Diagnosis not present

## 2018-08-22 DIAGNOSIS — I480 Paroxysmal atrial fibrillation: Secondary | ICD-10-CM | POA: Diagnosis not present

## 2018-08-22 DIAGNOSIS — J449 Chronic obstructive pulmonary disease, unspecified: Secondary | ICD-10-CM | POA: Diagnosis not present

## 2018-08-22 DIAGNOSIS — I639 Cerebral infarction, unspecified: Secondary | ICD-10-CM | POA: Diagnosis not present

## 2018-08-22 DIAGNOSIS — J9691 Respiratory failure, unspecified with hypoxia: Secondary | ICD-10-CM | POA: Diagnosis not present

## 2018-08-22 DIAGNOSIS — I5022 Chronic systolic (congestive) heart failure: Secondary | ICD-10-CM | POA: Diagnosis not present

## 2018-08-22 DIAGNOSIS — I249 Acute ischemic heart disease, unspecified: Secondary | ICD-10-CM | POA: Diagnosis not present

## 2018-08-22 LAB — CBC
HCT: 26.6 % — ABNORMAL LOW (ref 39.0–52.0)
HEMOGLOBIN: 8.2 g/dL — AB (ref 13.0–17.0)
MCH: 29.9 pg (ref 26.0–34.0)
MCHC: 30.8 g/dL (ref 30.0–36.0)
MCV: 97.1 fL (ref 80.0–100.0)
Platelets: 244 10*3/uL (ref 150–400)
RBC: 2.74 MIL/uL — AB (ref 4.22–5.81)
RDW: 14.3 % (ref 11.5–15.5)
WBC: 7.6 10*3/uL (ref 4.0–10.5)
nRBC: 0 % (ref 0.0–0.2)

## 2018-08-22 LAB — GLUCOSE, CAPILLARY
GLUCOSE-CAPILLARY: 141 mg/dL — AB (ref 70–99)
Glucose-Capillary: 159 mg/dL — ABNORMAL HIGH (ref 70–99)
Glucose-Capillary: 135 mg/dL — ABNORMAL HIGH (ref 70–99)
Glucose-Capillary: 179 mg/dL — ABNORMAL HIGH (ref 70–99)

## 2018-08-22 LAB — BASIC METABOLIC PANEL
Anion gap: 5 (ref 5–15)
BUN: 32 mg/dL — ABNORMAL HIGH (ref 8–23)
CHLORIDE: 101 mmol/L (ref 98–111)
CO2: 29 mmol/L (ref 22–32)
Calcium: 8.2 mg/dL — ABNORMAL LOW (ref 8.9–10.3)
Creatinine, Ser: 1.48 mg/dL — ABNORMAL HIGH (ref 0.61–1.24)
GFR calc Af Amer: 51 mL/min — ABNORMAL LOW (ref >60)
GFR calc non Af Amer: 44 mL/min — ABNORMAL LOW (ref >60)
Glucose, Bld: 147 mg/dL — ABNORMAL HIGH (ref 70–99)
Potassium: 4.1 mmol/L (ref 3.5–5.1)
SODIUM: 135 mmol/L (ref 135–145)

## 2018-08-22 MED ORDER — DONEPEZIL HCL 10 MG PO TABS: 10.0000 mg | ORAL_TABLET | Freq: Every day | ORAL | 0 refills | Status: AC

## 2018-08-22 MED ORDER — LOSARTAN POTASSIUM 25 MG PO TABS: 25.0000 mg | ORAL_TABLET | Freq: Every day | ORAL | 1 refills | Status: AC

## 2018-08-22 MED ORDER — CARVEDILOL 6.25 MG PO TABS: 6.2500 mg | ORAL_TABLET | Freq: Two times a day (BID) | ORAL | 3 refills | Status: AC

## 2018-08-22 MED ORDER — ROSUVASTATIN CALCIUM 10 MG PO TABS: 10.0000 mg | ORAL_TABLET | Freq: Every day | ORAL | 3 refills | Status: AC

## 2018-08-22 MED ORDER — NITROGLYCERIN 0.4 MG SL SUBL: 0.4000 mg | SUBLINGUAL_TABLET | SUBLINGUAL | 1 refills | Status: AC | PRN

## 2018-08-22 NOTE — Care Management Important Message (Signed)
Important Message  Patient Details  Name: Drew Weber MRN: [id number] Date of Birth: 1934-11-30   Medicare Important Message Given:  Yes    Akili Cuda P Ruchama Kubicek 08/22/2018, 2:10 PM

## 2018-08-22 NOTE — Discharge Summary (Addendum)
Discharge Summary    Patient ID: Drew Weber MRN: [id number]; DOB: [birth year]  Admit date: 08/15/2018 Discharge date: 08/22/2018  Primary Care Provider: Patient, No Pcp Per  Primary Cardiologist: Dr. Abran Richard [facility]) Primary Electrophysiologist:  None   Discharge Diagnoses    Active Problems:   Atrial fibrillation ([place])   Acute on chronic systolic (congestive) heart failure (HCC)   NICM (nonischemic cardiomyopathy) (HCC)   Allergies Allergies  Allergen Reactions  . Codeine Other (See Comments)    Hyperactivity Hyperactivity     Diagnostic Studies/Procedures    RIGHT/LEFT HEART CATH AND CORONARY ANGIOGRAPHY 08/17/2018  Conclusion    Ost LAD to Prox LAD lesion is 10% stenosed.  Prox LAD to Mid LAD lesion is 5% stenosed.  Ost 1st Diag to 1st Diag lesion is 5% stenosed.  2nd Mrg lesion is 70% stenosed.  Dist RCA-1 lesion is 20% stenosed.  Dist RCA-2 lesion is 20% stenosed.   Moderate coronary obstructive disease with mild calcification of the proximal LAD with widely patent stents in the diagonal and mid LAD; normal ramus intermediate; left circumflex vessel with focal 70% stenosis in a small marginal branch; dominant RCA with mild 20% tandem distal stenoses prior to the PDA takeoff.  Low right heart pressures with initial PW mean at 4.  His recent echo documentation of EF of 50%, findings are consistent with HFrEF with nonischemic cardiomyopathy.  RECOMMENDATION: Guideline directed medical therapy for acute on chronic combined heart failure.  Medical therapy for CAD. Patient has been on chronic dual antiplatelet therapy.   Diagnostic  Dominance: Right  ______________________   Echocardiogram 08/17/2018:  Ost LAD to Prox LAD lesion is 10% stenosed.  Prox LAD to Mid LAD lesion is 5% stenosed.  Ost 1st Diag to 1st Diag lesion is 5% stenosed.  2nd Mrg lesion is 70% stenosed.  Dist RCA-1 lesion is 20% stenosed. Dist RCA-2 lesion is 20%  stenosed.   History of Present Illness     Drew Weber is a 83 y.o. male with history of ischemic cardiomyopathy (EF has been less than 35% for a number of years, he has refused ICD placement), diabetes on oral agents, paroxysmal atrial fibrillation, prior stroke, hypertension, and dementia, who presents as a transfer from [place] for further management of acute decompensated heart failure.  The patient initially presented to his PCPs office on 11/20, complaining of shortness of breath and weakness for about a week.  He was started on p.o. Levaquin, with minimal improvement.  He ultimately presented to the [facility] ED on 11/23.  There, his initial labs were notable for a lactate of 5.4, creatinine of 1.3, and a hemoglobin of approximately 8.  Due to concern for underlying sepsis, he was given vigorous IV fluids, but subsequently developed hypoxemic respiratory failure requiring BiPAP.  He was also noted to be in atrial fibrillation with RVR.  He was started on IV heparin and IV diltiazem.  As his infectious work-up was ultimately negative, they began aggressive IV diuresis, with rapid improvement of oxygenation.  He was quickly weaned back to nasal cannula.  He had an echocardiogram on 11/24 that showed severely depressed ejection fraction of less than 15%.  His troponins were mildly elevated at [place], as high as 3.5.  Per the patient, he has had no chest pain at any point preceding or during this hospitalization.  He was ultimately transferred to [facility] for further management and consideration of cardiac catheterization.  Upon my interview, the patient denies any acute  complaints.  His shortness of breath is much improved.   Hospital Course     Consultants: None  The patient arrived to [facility] on a diltiazem drip and in sinus rhythm.  He underwent cardiac catheterization on 08/17/2018 to evaluate for markedly decreased LVEF of 15% by echo.  Left/right heart cath revealed mild to moderate  coronary artery disease and normal filling pressures (see Above report).  No intervention was done.  The case was discussed with the patient's cardiologist, Dr. Otho Perl.  The patient had refused an ICD in the past.  For medical management of nonischemic cardiomyopathy Entresto was discontinued due to hypotension.  He was therefore started on low-dose losartan 25 mg daily.  He was started on low-dose Coreg which was titrated up to 6.25 mg twice daily.  He was initially diuresed but with normal filling pressures on cath the diuretic was discontinued.  Aspirin 81 mg, clopidogrel 75 mg, and statin were continued.  He continued to maintain sinus rhythm with PVCs and NSVT.  The patient has a history of dementia and is now  DO NOT RESUSCITATE status.  He was evaluated by physical therapy and felt to need skilled nursing and the plan is to discharge him to [place] in [place].  Serum creatinine had increased to 1.48 and he will need close follow-up of renal function.  His ARB may need to be discontinued if renal function worsens.  Potassium is 4.1 on day of discharge.  His hemoglobin is 8.2 and will need to be followed by internal medicine.  Patient has been seen by Dr. Harrington Challenger today and deemed ready for discharge home. All follow up appointments have been scheduled. Discharge medications are listed below.  _____________  Discharge Vitals Blood pressure (!) 106/59, pulse 79, temperature 97.8 F (36.6 C), temperature source Oral, resp. rate 17, height 5\' 6"  (1.676 m), weight 61.8 kg, SpO2 96 %.  Filed Weights   08/19/18 0524 08/20/18 0521 08/22/18 0637  Weight: 61.1 kg 62 kg 61.8 kg    Labs & Radiologic Studies    CBC Recent Labs    08/22/18 0344  WBC 7.6  HGB 8.2*  HCT 26.6*  MCV 97.1  PLT 947   Basic Metabolic Panel Recent Labs    08/21/18 0434 08/22/18 0344  NA 134* 135  K 5.5* 4.1  CL 102 101  CO2 22 29  GLUCOSE 153* 147*  BUN 35* 32*  CREATININE 1.47* 1.48*  CALCIUM  8.2* 8.2*   Liver Function Tests No results for input(s): AST, ALT, ALKPHOS, BILITOT, PROT, ALBUMIN in the last 72 hours. No results for input(s): LIPASE, AMYLASE in the last 72 hours. Cardiac Enzymes No results for input(s): CKTOTAL, CKMB, CKMBINDEX, TROPONINI in the last 72 hours. BNP Invalid input(s): POCBNP D-Dimer No results for input(s): DDIMER in the last 72 hours. Hemoglobin A1C No results for input(s): HGBA1C in the last 72 hours. Fasting Lipid Panel No results for input(s): CHOL, HDL, LDLCALC, TRIG, CHOLHDL, LDLDIRECT in the last 72 hours. Thyroid Function Tests No results for input(s): TSH, T4TOTAL, T3FREE, THYROIDAB in the last 72 hours.  Invalid input(s): FREET3 _____________  Dg Chest Port 1 View  Result Date: 08/15/2018 CLINICAL DATA:  83 y/o  M; short of breath. EXAM: PORTABLE CHEST 1 VIEW COMPARISON:  08/13/2018 chest radiograph FINDINGS: Stable heart size and mediastinal contours are within normal limits. Both lungs are clear. The visualized skeletal structures are unremarkable. IMPRESSION: No active disease. Electronically Signed   By: Kristine Garbe  M.D.   On: 08/15/2018 23:00   Disposition   Pt is being discharged home today in good condition.  Follow-up Plans & Appointments     Contact information for follow-up providers    Flossie Buffy., MD Follow up.   Specialty:  Cardiology Why:  Hospital follow-up at the [facility] office, [address] On 08/26/2018 at 10:00 AM.  Contact information: [place] [address] [phone]            Contact information for after-discharge care    Destination    [facility] .   Service:  Skilled Chiropodist information: [place] [phone]                 Discharge Instructions    ([place]) Call MD:  Anytime you have any of the following symptoms: 1) 3 pound weight gain in  24 hours or 5 pounds in 1 week 2) shortness of breath, with or without a dry hacking cough 3) swelling in the hands, feet or stomach 4) if you have to sleep on extra pillows at night in order to breathe.   Complete by:  As directed    Diet - low sodium heart healthy   Complete by:  As directed    Increase activity slowly   Complete by:  As directed       Discharge Medications   Allergies as of 08/22/2018      Reactions   Codeine Other (See Comments)   Hyperactivity Hyperactivity      Medication List    STOP taking these medications   Coenzyme Q10-Red Yeast Rice 25-600 MG Caps   ENTRESTO 24-26 MG Generic drug:  sacubitril-valsartan   levofloxacin 500 MG tablet Commonly known as:  LEVAQUIN   metoprolol succinate 100 MG 24 hr tablet Commonly known as:  TOPROL-XL   predniSONE 20 MG tablet Commonly known as:  DELTASONE     TAKE these medications   aspirin EC 81 MG tablet Take 81 mg by mouth daily.   carvedilol 6.25 MG tablet Commonly known as:  COREG Take 1 tablet (6.25 mg total) by mouth 2 (two) times daily with a meal.   cetirizine 10 MG tablet Commonly known as:  ZYRTEC Take 10 mg by mouth daily.   clopidogrel 75 MG tablet Commonly known as:  PLAVIX Take 75 mg by mouth daily.   donepezil 10 MG tablet Commonly known as:  ARICEPT Take 1 tablet (10 mg total) by mouth at bedtime. What changed:    medication strength  how much to take  when to take this   ferrous sulfate 325 (65 FE) MG tablet Take 325 mg by mouth daily.   leuprolide 30 MG injection Commonly known as:  LUPRON Inject 30 mg into the muscle every 6 (six) months.   losartan 25 MG tablet Commonly known as:  COZAAR Take 1 tablet (25 mg total) by mouth daily. Start taking on:  08/23/2018   METAMUCIL FIBER PO Take 5 mLs by mouth daily as needed for constipation.   metFORMIN 1000 MG tablet Commonly known as:  GLUCOPHAGE Take 1,000 mg by mouth 2 (two) times daily.   nitroGLYCERIN 0.4 MG  SL tablet Commonly known as:  NITROSTAT Place 1 tablet (0.4 mg total) under the tongue every 5 (five) minutes x 3 doses as needed for chest pain.   Omega-3 1400 MG Caps Take 1,400 mg by mouth  daily.   pantoprazole 40 MG tablet Commonly known as:  PROTONIX Take 40 mg by mouth daily.   rosuvastatin 10 MG tablet Commonly known as:  CRESTOR Take 1 tablet (10 mg total) by mouth daily at 6 PM. What changed:    medication strength  how much to take  when to take this         Outstanding Labs/Studies   BMet to monitor renal function and potassium.   Duration of Discharge Encounter   Greater than 30 minutes including physician time.  Signed, Daune Perch, NP 08/22/2018, 3:39 PM

## 2018-08-22 NOTE — Progress Notes (Signed)
Physical Therapy Treatment Patient Details Name: Drew Weber MRN: [id number] DOB: Mar 26, 1935 Today's Date: 08/22/2018    History of Present Illness Pt is a 83 y.o. M with significant PMH of CVA, CAD, and PAF who presents with acute decompensated heart failure. Cath showed moderate non obstructive CAD - LVEF reportedly reduced to 15%.    PT Comments    Pt admitted with above diagnosis. Pt currently with functional limitations due to balance and endurance deficits. Pt was able to ambulate to hallway with RW with +2 mod to min assist with chair follow. Posterior lean and unstable gait. Will follow acutely.  Pt will benefit from skilled PT to increase their independence and safety with mobility to allow discharge to the venue listed below.     Follow Up Recommendations  SNF;Supervision/Assistance - 24 hour     Equipment Recommendations  Rolling walker with 5" wheels    Recommendations for Other Services OT consult     Precautions / Restrictions Precautions Precautions: Fall Precaution Comments: sits abruptly Restrictions Weight Bearing Restrictions: No    Mobility  Bed Mobility Overal bed mobility: Modified Independent             General bed mobility comments: from relatively flat bed, no difficulty or use of rail  Transfers Overall transfer level: Needs assistance Equipment used: Rolling walker (2 wheeled) Transfers: Sit to/from Stand Sit to Stand: Min assist;Mod assist         General transfer comment: assist to rise and steady, posterior lean and needs incr time to obtain standing balance. sits abruptly  Ambulation/Gait Ambulation/Gait assistance: Min assist;Mod assist;+2 safety/equipment Gait Distance (Feet): 85 Feet Assistive device: Rolling walker (2 wheeled) Gait Pattern/deviations: Step-through pattern;Trunk flexed;Decreased stride length   Gait velocity interpretation: <1.31 ft/sec, indicative of household ambulator General Gait Details: Pt  progressing with ambulation able to walk into hallway with min to mod assist for stability as he leans posteriorly at times.  Shaky toward end of walk as well.  Cues for sequencing.     Stairs             Wheelchair Mobility    Modified Rankin (Stroke Patients Only)       Balance Overall balance assessment: Needs assistance Sitting-balance support: Feet supported Sitting balance-Leahy Scale: Good     Standing balance support: Bilateral upper extremity supported Standing balance-Leahy Scale: Poor Standing balance comment: reliant on external support and RW, posterior lean noted.                             Cognition Arousal/Alertness: Awake/alert Behavior During Therapy: WFL for tasks assessed/performed Overall Cognitive Status: History of cognitive impairments - at baseline                                 General Comments: hx of dementia, very pleasant      Exercises      General Comments        Pertinent Vitals/Pain Pain Assessment: No/denies pain    Home Living                      Prior Function            PT Goals (current goals can now be found in the care plan section) Acute Rehab PT Goals Patient Stated Goal: to get stronger Progress towards PT goals: Progressing toward  goals    Frequency    Min 3X/week      PT Plan Current plan remains appropriate    Co-evaluation              AM-PAC PT "6 Clicks" Mobility   Outcome Measure  Help needed turning from your back to your side while in a flat bed without using bedrails?: A Little Help needed moving from lying on your back to sitting on the side of a flat bed without using bedrails?: A Little Help needed moving to and from a bed to a chair (including a wheelchair)?: A Little Help needed standing up from a chair using your arms (e.g., wheelchair or bedside chair)?: A Lot Help needed to walk in hospital room?: A Lot Help needed climbing 3-5 steps  with a railing? : Total 6 Click Score: 14    End of Session Equipment Utilized During Treatment: Gait belt Activity Tolerance: Patient tolerated treatment well Patient left: in chair;with call bell/phone within reach;with chair alarm set;with family/visitor present Nurse Communication: Mobility status PT Visit Diagnosis: Unsteadiness on feet (R26.81);Difficulty in walking, not elsewhere classified (R26.2)     Time: 1448-1500 PT Time Calculation (min) (ACUTE ONLY): 12 min  Charges:  $Gait Training: 8-22 mins                     [place] Pager:  [phone]  Office:  [place] 08/22/2018, 4:12 PM

## 2018-08-22 NOTE — Progress Notes (Signed)
Progress Note  Patient Name: Drew Weber [facility] Date of Encounter: 08/22/2018  Primary Cardiologist: Dr. Abran Richard [facility])  Subjective  Breathing is OK   NO CP   Inpatient Medications    Scheduled Meds: . aspirin EC  81 mg Oral Daily  . carvedilol  6.25 mg Oral BID WC  . clopidogrel  75 mg Oral Daily  . donepezil  10 mg Oral QHS  . heparin  5,000 Units Subcutaneous Q8H  . insulin aspart  0-5 Units Subcutaneous QHS  . insulin aspart  0-9 Units Subcutaneous TID WC  . insulin glargine  10 Units Subcutaneous QHS  . losartan  25 mg Oral Daily  . mouth rinse  15 mL Mouth Rinse BID  . pantoprazole  40 mg Oral Daily  . rosuvastatin  10 mg Oral q1800  . sodium chloride flush  3 mL Intravenous Q12H   Continuous Infusions: . sodium chloride 20 mL/hr at 08/17/18 0630  . sodium chloride     PRN Meds: sodium chloride, acetaminophen, diazepam, magnesium hydroxide, nitroGLYCERIN, ondansetron (ZOFRAN) IV, sodium chloride flush   Vital Signs    Vitals:   08/22/18 0049 08/22/18 0437 08/22/18 0637 08/22/18 0741  BP: (!) 107/50 109/64 108/67 103/72  Pulse: 73  100 97  Resp: (!) 23 20  20   Temp: (!) 97.5 F (36.4 C)  98 F (36.7 C) 98.9 F (37.2 C)  TempSrc: Oral  Oral Oral  SpO2: 95%  95% 97%  Weight:   61.8 kg   Height:        Intake/Output Summary (Last 24 hours) at 08/22/2018 0827 Last data filed at 08/22/2018 2585 Gross per 24 hour  Intake 702 ml  Output 650 ml  Net 52 ml   Filed Weights   08/19/18 0524 08/20/18 0521 08/22/18 0637  Weight: 61.1 kg 62 kg 61.8 kg    Telemetry    11 beat NSVT.  Personally reviewed.  Physical Exam   GEN:  Elderly male.  No acute distress.   Neck:  JVP is normal   Cardiac: RRR, no murmur, rub, or gallop.  Respiratory: Nonlabored. Clear to auscultation bilaterally. GI: Soft, nontender, bowel sounds present. MS: No edema; No deformity.  Labs    Chemistry Recent Labs  Lab 08/18/18 0400 08/21/18 0434 08/22/18 0344  NA 140  134* 135  K 4.0 5.5* 4.1  CL 101 102 101  CO2 25 22 29   GLUCOSE 165* 153* 147*  BUN 25* 35* 32*  CREATININE 1.47* 1.47* 1.48*  CALCIUM 8.8* 8.2* 8.2*  GFRNONAA 44* 44* 44*  GFRAA 51* 51* 51*  ANIONGAP 14 10 5      Hematology Recent Labs  Lab 08/17/18 1154 08/18/18 0400 08/22/18 0344  WBC 7.0 8.9 7.6  RBC 3.29* 3.23* 2.74*  HGB 10.0* 9.8* 8.2*  HCT 31.3* 30.8* 26.6*  MCV 95.1 95.4 97.1  MCH 30.4 30.3 29.9  MCHC 31.9 31.8 30.8  RDW 14.3 14.4 14.3  PLT 271 291 244    Cardiac Enzymes Recent Labs  Lab 08/16/18 1055  TROPONINI 0.77*   No results for input(s): TROPIPOC in the last 168 hours.    Radiology    No results found.  Cardiac Studies   Echocardiogram 08/17/2018:  Ost LAD to Prox LAD lesion is 10% stenosed.  Prox LAD to Mid LAD lesion is 5% stenosed.  Ost 1st Diag to 1st Diag lesion is 5% stenosed.  2nd Mrg lesion is 70% stenosed.  Dist RCA-1 lesion is 20% stenosed.  Dist RCA-2 lesion is 20% stenosed.  Moderate coronary obstructive disease with mild calcification of the proximal LAD with widely patent stents in the diagonal and mid LAD; normal ramus intermediate; left circumflex vessel with focal 70% stenosis in a small marginal branch; dominant RCA with mild 20% tandem distal stenoses prior to the PDA takeoff.  Low right heart pressures with initial PW mean at 4.  Patient Profile     83 y.o. male with a history of type 2 diabetes mellitus, previous stroke, dementia, nonischemic cardiomyopathy, and moderate nonobstructive CAD.  He is being managed for acute on chronic systolic heart failure.  Assessment & Plan    1.  Acute on chronic systolic heart failure LVEF less than 15%. Plan to continue medical Rx   Volume status is not bad    2.  Moderate nonobstructive CAD recent cardiac catheterization.  No angina    3.  Renal  Acute on chronic renal insufficiency, creatinine stable at 1.48     4  Anemia   Hgb is 8.2 today    Denies blood in BM  Was  9.1  Last week   WIll need to be followed closely   IM following   Pt on ASA   Defer anemia eval / Rx to IM  Plan for d/c to [facility]   Will notify Dr Ottie Glazier office of admit   Pt will follow up there    Signed, Dorris Carnes, MD  08/22/2018, 8:27 AM

## 2018-08-22 NOTE — Progress Notes (Addendum)
Called [facility] to give report. Left a message on Tracy's voicemail to call RN on [phone]. Will give AVS to PTAR and wife at bedside. I will continue to monitor the patient closely.   Saddie Benders RN  Spoke to the nurse working on the floor and gave report.

## 2018-08-22 NOTE — Clinical Social Work Placement (Signed)
   CLINICAL SOCIAL WORK PLACEMENT  NOTE  Date:  08/22/2018  Patient Details  Name: Drew Weber MRN: [id number] Date of Birth: May 08, 1935  Clinical Social Work is seeking post-discharge placement for this patient at the [place] level of care (*CSW will initial, date and re-position this form in  chart as items are completed):      Patient/family provided with [facility]'s list of facilities offering this level of care within the geographic area requested by the patient (or if unable, by the patient's family).      Patient/family informed of their freedom to choose among providers that offer the needed level of care, that participate in Medicare, Medicaid or managed care program needed by the patient, have an available bed and are willing to accept the patient.      Patient/family informed of [facility]'s ownership interest in [facility] and [facility], as well as of the fact that they are under no obligation to receive care at these facilities.  PASRR submitted to EDS on 08/18/18     PASRR number received on 08/18/18     Existing PASRR number confirmed on       FL2 transmitted to all facilities in geographic area requested by pt/family on 08/18/18     FL2 transmitted to all facilities within larger geographic area on       Patient informed that his/her managed care company has contracts with or will negotiate with certain facilities, including the following:  [facility], [place]     Yes   Patient/family informed of bed offers received.  Patient chooses bed at [facility], [facility]     Physician recommends and patient chooses bed at      Patient to be transferred to [facility] on 08/22/18.  Patient to be transferred to facility by [facility]     Patient family notified on 08/22/18 of transfer.  Name of family member notified:  Eilleen Kempf, spouse     PHYSICIAN Please prepare priority discharge summary, including  medications, Please prepare prescriptions     Additional Comment:    _______________________________________________ Estanislado Emms, LCSW 08/22/2018, 2:36 PM

## 2018-08-22 NOTE — Progress Notes (Addendum)
Patient will discharge to [facility] Anticipated discharge date: 08/22/18 Family notified: Eilleen Kempf, spouse Transportation by: [facility]  Nurse to call report to [phone]. Patient will go to room 706 at the facility.  CSW signing off.  Estanislado Emms, [place]  Clinical Social Worker

## 2018-08-23 DIAGNOSIS — R262 Difficulty in walking, not elsewhere classified: Secondary | ICD-10-CM | POA: Diagnosis not present

## 2018-08-29 DIAGNOSIS — I5022 Chronic systolic (congestive) heart failure: Secondary | ICD-10-CM | POA: Diagnosis not present

## 2018-08-29 DIAGNOSIS — I420 Dilated cardiomyopathy: Secondary | ICD-10-CM | POA: Diagnosis not present

## 2018-08-29 DIAGNOSIS — I110 Hypertensive heart disease with heart failure: Secondary | ICD-10-CM | POA: Diagnosis not present

## 2018-08-29 DIAGNOSIS — I2510 Atherosclerotic heart disease of native coronary artery without angina pectoris: Secondary | ICD-10-CM | POA: Diagnosis not present

## 2018-08-29 DIAGNOSIS — Z955 Presence of coronary angioplasty implant and graft: Secondary | ICD-10-CM | POA: Diagnosis not present

## 2018-09-05 DIAGNOSIS — Z7984 Long term (current) use of oral hypoglycemic drugs: Secondary | ICD-10-CM | POA: Diagnosis not present

## 2018-09-05 DIAGNOSIS — K219 Gastro-esophageal reflux disease without esophagitis: Secondary | ICD-10-CM | POA: Diagnosis not present

## 2018-09-05 DIAGNOSIS — I480 Paroxysmal atrial fibrillation: Secondary | ICD-10-CM | POA: Diagnosis not present

## 2018-09-05 DIAGNOSIS — I5023 Acute on chronic systolic (congestive) heart failure: Secondary | ICD-10-CM | POA: Diagnosis not present

## 2018-09-05 DIAGNOSIS — Z8673 Personal history of transient ischemic attack (TIA), and cerebral infarction without residual deficits: Secondary | ICD-10-CM | POA: Diagnosis not present

## 2018-09-05 DIAGNOSIS — Z791 Long term (current) use of non-steroidal anti-inflammatories (NSAID): Secondary | ICD-10-CM | POA: Diagnosis not present

## 2018-09-05 DIAGNOSIS — D649 Anemia, unspecified: Secondary | ICD-10-CM | POA: Diagnosis not present

## 2018-09-05 DIAGNOSIS — I110 Hypertensive heart disease with heart failure: Secondary | ICD-10-CM | POA: Diagnosis not present

## 2018-09-05 DIAGNOSIS — Z7902 Long term (current) use of antithrombotics/antiplatelets: Secondary | ICD-10-CM | POA: Diagnosis not present

## 2018-09-05 DIAGNOSIS — Z9181 History of falling: Secondary | ICD-10-CM | POA: Diagnosis not present

## 2018-09-05 DIAGNOSIS — Z7982 Long term (current) use of aspirin: Secondary | ICD-10-CM | POA: Diagnosis not present

## 2018-09-05 DIAGNOSIS — E119 Type 2 diabetes mellitus without complications: Secondary | ICD-10-CM | POA: Diagnosis not present

## 2018-09-05 DIAGNOSIS — I428 Other cardiomyopathies: Secondary | ICD-10-CM | POA: Diagnosis not present

## 2018-09-05 DIAGNOSIS — I2510 Atherosclerotic heart disease of native coronary artery without angina pectoris: Secondary | ICD-10-CM | POA: Diagnosis not present

## 2018-09-07 DIAGNOSIS — Z7984 Long term (current) use of oral hypoglycemic drugs: Secondary | ICD-10-CM | POA: Diagnosis not present

## 2018-09-07 DIAGNOSIS — Z9181 History of falling: Secondary | ICD-10-CM | POA: Diagnosis not present

## 2018-09-07 DIAGNOSIS — I480 Paroxysmal atrial fibrillation: Secondary | ICD-10-CM | POA: Diagnosis not present

## 2018-09-07 DIAGNOSIS — I428 Other cardiomyopathies: Secondary | ICD-10-CM | POA: Diagnosis not present

## 2018-09-07 DIAGNOSIS — I5023 Acute on chronic systolic (congestive) heart failure: Secondary | ICD-10-CM | POA: Diagnosis not present

## 2018-09-07 DIAGNOSIS — Z8673 Personal history of transient ischemic attack (TIA), and cerebral infarction without residual deficits: Secondary | ICD-10-CM | POA: Diagnosis not present

## 2018-09-07 DIAGNOSIS — K219 Gastro-esophageal reflux disease without esophagitis: Secondary | ICD-10-CM | POA: Diagnosis not present

## 2018-09-07 DIAGNOSIS — Z7902 Long term (current) use of antithrombotics/antiplatelets: Secondary | ICD-10-CM | POA: Diagnosis not present

## 2018-09-07 DIAGNOSIS — I110 Hypertensive heart disease with heart failure: Secondary | ICD-10-CM | POA: Diagnosis not present

## 2018-09-07 DIAGNOSIS — E119 Type 2 diabetes mellitus without complications: Secondary | ICD-10-CM | POA: Diagnosis not present

## 2018-09-07 DIAGNOSIS — Z791 Long term (current) use of non-steroidal anti-inflammatories (NSAID): Secondary | ICD-10-CM | POA: Diagnosis not present

## 2018-09-07 DIAGNOSIS — D649 Anemia, unspecified: Secondary | ICD-10-CM | POA: Diagnosis not present

## 2018-09-07 DIAGNOSIS — I2510 Atherosclerotic heart disease of native coronary artery without angina pectoris: Secondary | ICD-10-CM | POA: Diagnosis not present

## 2018-09-07 DIAGNOSIS — Z7982 Long term (current) use of aspirin: Secondary | ICD-10-CM | POA: Diagnosis not present

## 2018-09-09 DIAGNOSIS — R5381 Other malaise: Secondary | ICD-10-CM | POA: Diagnosis not present

## 2018-09-09 DIAGNOSIS — I5022 Chronic systolic (congestive) heart failure: Secondary | ICD-10-CM | POA: Diagnosis not present

## 2018-09-16 DIAGNOSIS — E875 Hyperkalemia: Secondary | ICD-10-CM | POA: Diagnosis not present

## 2018-09-20 DIAGNOSIS — Z9181 History of falling: Secondary | ICD-10-CM | POA: Diagnosis not present

## 2018-09-20 DIAGNOSIS — K219 Gastro-esophageal reflux disease without esophagitis: Secondary | ICD-10-CM | POA: Diagnosis not present

## 2018-09-20 DIAGNOSIS — Z7984 Long term (current) use of oral hypoglycemic drugs: Secondary | ICD-10-CM | POA: Diagnosis not present

## 2018-09-20 DIAGNOSIS — Z7902 Long term (current) use of antithrombotics/antiplatelets: Secondary | ICD-10-CM | POA: Diagnosis not present

## 2018-09-20 DIAGNOSIS — I2510 Atherosclerotic heart disease of native coronary artery without angina pectoris: Secondary | ICD-10-CM | POA: Diagnosis not present

## 2018-09-20 DIAGNOSIS — I428 Other cardiomyopathies: Secondary | ICD-10-CM | POA: Diagnosis not present

## 2018-09-20 DIAGNOSIS — I5023 Acute on chronic systolic (congestive) heart failure: Secondary | ICD-10-CM | POA: Diagnosis not present

## 2018-09-20 DIAGNOSIS — Z8673 Personal history of transient ischemic attack (TIA), and cerebral infarction without residual deficits: Secondary | ICD-10-CM | POA: Diagnosis not present

## 2018-09-20 DIAGNOSIS — I110 Hypertensive heart disease with heart failure: Secondary | ICD-10-CM | POA: Diagnosis not present

## 2018-09-20 DIAGNOSIS — E119 Type 2 diabetes mellitus without complications: Secondary | ICD-10-CM | POA: Diagnosis not present

## 2018-09-20 DIAGNOSIS — Z791 Long term (current) use of non-steroidal anti-inflammatories (NSAID): Secondary | ICD-10-CM | POA: Diagnosis not present

## 2018-09-20 DIAGNOSIS — Z7982 Long term (current) use of aspirin: Secondary | ICD-10-CM | POA: Diagnosis not present

## 2018-09-20 DIAGNOSIS — D649 Anemia, unspecified: Secondary | ICD-10-CM | POA: Diagnosis not present

## 2018-09-20 DIAGNOSIS — I480 Paroxysmal atrial fibrillation: Secondary | ICD-10-CM | POA: Diagnosis not present

## 2018-09-22 DIAGNOSIS — I110 Hypertensive heart disease with heart failure: Secondary | ICD-10-CM | POA: Diagnosis not present

## 2018-09-22 DIAGNOSIS — D649 Anemia, unspecified: Secondary | ICD-10-CM | POA: Diagnosis not present

## 2018-09-22 DIAGNOSIS — Z7984 Long term (current) use of oral hypoglycemic drugs: Secondary | ICD-10-CM | POA: Diagnosis not present

## 2018-09-22 DIAGNOSIS — K219 Gastro-esophageal reflux disease without esophagitis: Secondary | ICD-10-CM | POA: Diagnosis not present

## 2018-09-22 DIAGNOSIS — Z7902 Long term (current) use of antithrombotics/antiplatelets: Secondary | ICD-10-CM | POA: Diagnosis not present

## 2018-09-22 DIAGNOSIS — Z8673 Personal history of transient ischemic attack (TIA), and cerebral infarction without residual deficits: Secondary | ICD-10-CM | POA: Diagnosis not present

## 2018-09-22 DIAGNOSIS — I480 Paroxysmal atrial fibrillation: Secondary | ICD-10-CM | POA: Diagnosis not present

## 2018-09-22 DIAGNOSIS — Z7982 Long term (current) use of aspirin: Secondary | ICD-10-CM | POA: Diagnosis not present

## 2018-09-22 DIAGNOSIS — Z791 Long term (current) use of non-steroidal anti-inflammatories (NSAID): Secondary | ICD-10-CM | POA: Diagnosis not present

## 2018-09-22 DIAGNOSIS — Z9181 History of falling: Secondary | ICD-10-CM | POA: Diagnosis not present

## 2018-09-22 DIAGNOSIS — I5023 Acute on chronic systolic (congestive) heart failure: Secondary | ICD-10-CM | POA: Diagnosis not present

## 2018-09-22 DIAGNOSIS — E119 Type 2 diabetes mellitus without complications: Secondary | ICD-10-CM | POA: Diagnosis not present

## 2018-09-22 DIAGNOSIS — I2510 Atherosclerotic heart disease of native coronary artery without angina pectoris: Secondary | ICD-10-CM | POA: Diagnosis not present

## 2018-09-22 DIAGNOSIS — I428 Other cardiomyopathies: Secondary | ICD-10-CM | POA: Diagnosis not present

## 2018-09-27 DIAGNOSIS — Z7984 Long term (current) use of oral hypoglycemic drugs: Secondary | ICD-10-CM | POA: Diagnosis not present

## 2018-09-27 DIAGNOSIS — I2510 Atherosclerotic heart disease of native coronary artery without angina pectoris: Secondary | ICD-10-CM | POA: Diagnosis not present

## 2018-09-27 DIAGNOSIS — I480 Paroxysmal atrial fibrillation: Secondary | ICD-10-CM | POA: Diagnosis not present

## 2018-09-27 DIAGNOSIS — I110 Hypertensive heart disease with heart failure: Secondary | ICD-10-CM | POA: Diagnosis not present

## 2018-09-27 DIAGNOSIS — Z9181 History of falling: Secondary | ICD-10-CM | POA: Diagnosis not present

## 2018-09-27 DIAGNOSIS — E119 Type 2 diabetes mellitus without complications: Secondary | ICD-10-CM | POA: Diagnosis not present

## 2018-09-27 DIAGNOSIS — Z7902 Long term (current) use of antithrombotics/antiplatelets: Secondary | ICD-10-CM | POA: Diagnosis not present

## 2018-09-27 DIAGNOSIS — K219 Gastro-esophageal reflux disease without esophagitis: Secondary | ICD-10-CM | POA: Diagnosis not present

## 2018-09-27 DIAGNOSIS — Z7982 Long term (current) use of aspirin: Secondary | ICD-10-CM | POA: Diagnosis not present

## 2018-09-27 DIAGNOSIS — D649 Anemia, unspecified: Secondary | ICD-10-CM | POA: Diagnosis not present

## 2018-09-27 DIAGNOSIS — Z791 Long term (current) use of non-steroidal anti-inflammatories (NSAID): Secondary | ICD-10-CM | POA: Diagnosis not present

## 2018-09-27 DIAGNOSIS — Z8673 Personal history of transient ischemic attack (TIA), and cerebral infarction without residual deficits: Secondary | ICD-10-CM | POA: Diagnosis not present

## 2018-09-27 DIAGNOSIS — I5023 Acute on chronic systolic (congestive) heart failure: Secondary | ICD-10-CM | POA: Diagnosis not present

## 2018-09-27 DIAGNOSIS — I428 Other cardiomyopathies: Secondary | ICD-10-CM | POA: Diagnosis not present

## 2018-09-29 DIAGNOSIS — K219 Gastro-esophageal reflux disease without esophagitis: Secondary | ICD-10-CM | POA: Diagnosis not present

## 2018-09-29 DIAGNOSIS — I2510 Atherosclerotic heart disease of native coronary artery without angina pectoris: Secondary | ICD-10-CM | POA: Diagnosis not present

## 2018-09-29 DIAGNOSIS — I110 Hypertensive heart disease with heart failure: Secondary | ICD-10-CM | POA: Diagnosis not present

## 2018-09-29 DIAGNOSIS — Z7982 Long term (current) use of aspirin: Secondary | ICD-10-CM | POA: Diagnosis not present

## 2018-09-29 DIAGNOSIS — I480 Paroxysmal atrial fibrillation: Secondary | ICD-10-CM | POA: Diagnosis not present

## 2018-09-29 DIAGNOSIS — Z9181 History of falling: Secondary | ICD-10-CM | POA: Diagnosis not present

## 2018-09-29 DIAGNOSIS — Z8673 Personal history of transient ischemic attack (TIA), and cerebral infarction without residual deficits: Secondary | ICD-10-CM | POA: Diagnosis not present

## 2018-09-29 DIAGNOSIS — D649 Anemia, unspecified: Secondary | ICD-10-CM | POA: Diagnosis not present

## 2018-09-29 DIAGNOSIS — E119 Type 2 diabetes mellitus without complications: Secondary | ICD-10-CM | POA: Diagnosis not present

## 2018-09-29 DIAGNOSIS — I5023 Acute on chronic systolic (congestive) heart failure: Secondary | ICD-10-CM | POA: Diagnosis not present

## 2018-09-29 DIAGNOSIS — Z7902 Long term (current) use of antithrombotics/antiplatelets: Secondary | ICD-10-CM | POA: Diagnosis not present

## 2018-09-29 DIAGNOSIS — Z7984 Long term (current) use of oral hypoglycemic drugs: Secondary | ICD-10-CM | POA: Diagnosis not present

## 2018-09-29 DIAGNOSIS — I428 Other cardiomyopathies: Secondary | ICD-10-CM | POA: Diagnosis not present

## 2018-09-29 DIAGNOSIS — Z791 Long term (current) use of non-steroidal anti-inflammatories (NSAID): Secondary | ICD-10-CM | POA: Diagnosis not present

## 2018-09-30 DIAGNOSIS — D649 Anemia, unspecified: Secondary | ICD-10-CM | POA: Diagnosis not present

## 2018-09-30 DIAGNOSIS — I428 Other cardiomyopathies: Secondary | ICD-10-CM | POA: Diagnosis not present

## 2018-09-30 DIAGNOSIS — Z8673 Personal history of transient ischemic attack (TIA), and cerebral infarction without residual deficits: Secondary | ICD-10-CM | POA: Diagnosis not present

## 2018-09-30 DIAGNOSIS — I2510 Atherosclerotic heart disease of native coronary artery without angina pectoris: Secondary | ICD-10-CM | POA: Diagnosis not present

## 2018-09-30 DIAGNOSIS — Z791 Long term (current) use of non-steroidal anti-inflammatories (NSAID): Secondary | ICD-10-CM | POA: Diagnosis not present

## 2018-09-30 DIAGNOSIS — I480 Paroxysmal atrial fibrillation: Secondary | ICD-10-CM | POA: Diagnosis not present

## 2018-09-30 DIAGNOSIS — K219 Gastro-esophageal reflux disease without esophagitis: Secondary | ICD-10-CM | POA: Diagnosis not present

## 2018-09-30 DIAGNOSIS — Z7984 Long term (current) use of oral hypoglycemic drugs: Secondary | ICD-10-CM | POA: Diagnosis not present

## 2018-09-30 DIAGNOSIS — I110 Hypertensive heart disease with heart failure: Secondary | ICD-10-CM | POA: Diagnosis not present

## 2018-09-30 DIAGNOSIS — Z7982 Long term (current) use of aspirin: Secondary | ICD-10-CM | POA: Diagnosis not present

## 2018-09-30 DIAGNOSIS — Z7902 Long term (current) use of antithrombotics/antiplatelets: Secondary | ICD-10-CM | POA: Diagnosis not present

## 2018-09-30 DIAGNOSIS — I5023 Acute on chronic systolic (congestive) heart failure: Secondary | ICD-10-CM | POA: Diagnosis not present

## 2018-09-30 DIAGNOSIS — Z9181 History of falling: Secondary | ICD-10-CM | POA: Diagnosis not present

## 2018-09-30 DIAGNOSIS — E119 Type 2 diabetes mellitus without complications: Secondary | ICD-10-CM | POA: Diagnosis not present

## 2018-10-01 DIAGNOSIS — R112 Nausea with vomiting, unspecified: Secondary | ICD-10-CM | POA: Diagnosis not present

## 2018-10-01 DIAGNOSIS — I714 Abdominal aortic aneurysm, without rupture: Secondary | ICD-10-CM | POA: Diagnosis not present

## 2018-10-01 DIAGNOSIS — I110 Hypertensive heart disease with heart failure: Secondary | ICD-10-CM | POA: Diagnosis not present

## 2018-10-01 DIAGNOSIS — R110 Nausea: Secondary | ICD-10-CM | POA: Diagnosis not present

## 2018-10-01 DIAGNOSIS — N179 Acute kidney failure, unspecified: Secondary | ICD-10-CM | POA: Diagnosis not present

## 2018-10-01 DIAGNOSIS — R001 Bradycardia, unspecified: Secondary | ICD-10-CM | POA: Diagnosis not present

## 2018-10-01 DIAGNOSIS — R0989 Other specified symptoms and signs involving the circulatory and respiratory systems: Secondary | ICD-10-CM | POA: Diagnosis not present

## 2018-10-01 DIAGNOSIS — I959 Hypotension, unspecified: Secondary | ICD-10-CM | POA: Diagnosis not present

## 2018-10-01 DIAGNOSIS — R197 Diarrhea, unspecified: Secondary | ICD-10-CM | POA: Diagnosis not present

## 2018-10-01 DIAGNOSIS — I5023 Acute on chronic systolic (congestive) heart failure: Secondary | ICD-10-CM | POA: Diagnosis not present

## 2018-10-01 DIAGNOSIS — E860 Dehydration: Secondary | ICD-10-CM | POA: Diagnosis not present

## 2018-10-01 DIAGNOSIS — E1165 Type 2 diabetes mellitus with hyperglycemia: Secondary | ICD-10-CM | POA: Diagnosis not present

## 2018-10-01 DIAGNOSIS — R1111 Vomiting without nausea: Secondary | ICD-10-CM | POA: Diagnosis not present

## 2018-10-01 DIAGNOSIS — R531 Weakness: Secondary | ICD-10-CM | POA: Diagnosis not present

## 2018-10-03 DIAGNOSIS — Z8673 Personal history of transient ischemic attack (TIA), and cerebral infarction without residual deficits: Secondary | ICD-10-CM | POA: Diagnosis not present

## 2018-10-03 DIAGNOSIS — E119 Type 2 diabetes mellitus without complications: Secondary | ICD-10-CM | POA: Diagnosis not present

## 2018-10-03 DIAGNOSIS — I480 Paroxysmal atrial fibrillation: Secondary | ICD-10-CM | POA: Diagnosis not present

## 2018-10-03 DIAGNOSIS — Z7982 Long term (current) use of aspirin: Secondary | ICD-10-CM | POA: Diagnosis not present

## 2018-10-03 DIAGNOSIS — Z7902 Long term (current) use of antithrombotics/antiplatelets: Secondary | ICD-10-CM | POA: Diagnosis not present

## 2018-10-03 DIAGNOSIS — I110 Hypertensive heart disease with heart failure: Secondary | ICD-10-CM | POA: Diagnosis not present

## 2018-10-03 DIAGNOSIS — K219 Gastro-esophageal reflux disease without esophagitis: Secondary | ICD-10-CM | POA: Diagnosis not present

## 2018-10-03 DIAGNOSIS — Z7984 Long term (current) use of oral hypoglycemic drugs: Secondary | ICD-10-CM | POA: Diagnosis not present

## 2018-10-03 DIAGNOSIS — Z791 Long term (current) use of non-steroidal anti-inflammatories (NSAID): Secondary | ICD-10-CM | POA: Diagnosis not present

## 2018-10-03 DIAGNOSIS — I2510 Atherosclerotic heart disease of native coronary artery without angina pectoris: Secondary | ICD-10-CM | POA: Diagnosis not present

## 2018-10-03 DIAGNOSIS — D649 Anemia, unspecified: Secondary | ICD-10-CM | POA: Diagnosis not present

## 2018-10-03 DIAGNOSIS — I428 Other cardiomyopathies: Secondary | ICD-10-CM | POA: Diagnosis not present

## 2018-10-03 DIAGNOSIS — Z9181 History of falling: Secondary | ICD-10-CM | POA: Diagnosis not present

## 2018-10-03 DIAGNOSIS — I5023 Acute on chronic systolic (congestive) heart failure: Secondary | ICD-10-CM | POA: Diagnosis not present

## 2018-10-04 DIAGNOSIS — N289 Disorder of kidney and ureter, unspecified: Secondary | ICD-10-CM | POA: Diagnosis not present

## 2018-10-04 DIAGNOSIS — I4891 Unspecified atrial fibrillation: Secondary | ICD-10-CM | POA: Diagnosis not present

## 2018-10-04 DIAGNOSIS — E119 Type 2 diabetes mellitus without complications: Secondary | ICD-10-CM | POA: Diagnosis not present

## 2018-10-04 DIAGNOSIS — E860 Dehydration: Secondary | ICD-10-CM | POA: Diagnosis not present

## 2018-10-04 DIAGNOSIS — R0602 Shortness of breath: Secondary | ICD-10-CM | POA: Diagnosis not present

## 2018-10-04 DIAGNOSIS — Z9181 History of falling: Secondary | ICD-10-CM | POA: Diagnosis not present

## 2018-10-04 DIAGNOSIS — K7200 Acute and subacute hepatic failure without coma: Secondary | ICD-10-CM | POA: Diagnosis not present

## 2018-10-04 DIAGNOSIS — N179 Acute kidney failure, unspecified: Secondary | ICD-10-CM | POA: Diagnosis not present

## 2018-10-04 DIAGNOSIS — I509 Heart failure, unspecified: Secondary | ICD-10-CM | POA: Diagnosis not present

## 2018-10-04 DIAGNOSIS — Z79899 Other long term (current) drug therapy: Secondary | ICD-10-CM | POA: Diagnosis not present

## 2018-10-04 DIAGNOSIS — Z7982 Long term (current) use of aspirin: Secondary | ICD-10-CM | POA: Diagnosis not present

## 2018-10-04 DIAGNOSIS — Z7984 Long term (current) use of oral hypoglycemic drugs: Secondary | ICD-10-CM | POA: Diagnosis not present

## 2018-10-04 DIAGNOSIS — D649 Anemia, unspecified: Secondary | ICD-10-CM | POA: Diagnosis not present

## 2018-10-04 DIAGNOSIS — I5023 Acute on chronic systolic (congestive) heart failure: Secondary | ICD-10-CM | POA: Diagnosis not present

## 2018-10-04 DIAGNOSIS — I2510 Atherosclerotic heart disease of native coronary artery without angina pectoris: Secondary | ICD-10-CM | POA: Diagnosis not present

## 2018-10-04 DIAGNOSIS — R531 Weakness: Secondary | ICD-10-CM | POA: Diagnosis not present

## 2018-10-04 DIAGNOSIS — I959 Hypotension, unspecified: Secondary | ICD-10-CM | POA: Diagnosis not present

## 2018-10-04 DIAGNOSIS — I480 Paroxysmal atrial fibrillation: Secondary | ICD-10-CM | POA: Diagnosis not present

## 2018-10-04 DIAGNOSIS — Z8673 Personal history of transient ischemic attack (TIA), and cerebral infarction without residual deficits: Secondary | ICD-10-CM | POA: Diagnosis not present

## 2018-10-04 DIAGNOSIS — I428 Other cardiomyopathies: Secondary | ICD-10-CM | POA: Diagnosis not present

## 2018-10-04 DIAGNOSIS — Z7902 Long term (current) use of antithrombotics/antiplatelets: Secondary | ICD-10-CM | POA: Diagnosis not present

## 2018-10-04 DIAGNOSIS — K7290 Hepatic failure, unspecified without coma: Secondary | ICD-10-CM | POA: Diagnosis not present

## 2018-10-04 DIAGNOSIS — K219 Gastro-esophageal reflux disease without esophagitis: Secondary | ICD-10-CM | POA: Diagnosis not present

## 2018-10-04 DIAGNOSIS — R079 Chest pain, unspecified: Secondary | ICD-10-CM | POA: Diagnosis not present

## 2018-10-04 DIAGNOSIS — I447 Left bundle-branch block, unspecified: Secondary | ICD-10-CM | POA: Diagnosis not present

## 2018-10-04 DIAGNOSIS — I110 Hypertensive heart disease with heart failure: Secondary | ICD-10-CM | POA: Diagnosis not present

## 2018-10-04 DIAGNOSIS — R410 Disorientation, unspecified: Secondary | ICD-10-CM | POA: Diagnosis not present

## 2018-10-04 DIAGNOSIS — Z791 Long term (current) use of non-steroidal anti-inflammatories (NSAID): Secondary | ICD-10-CM | POA: Diagnosis not present

## 2018-10-05 DIAGNOSIS — K7200 Acute and subacute hepatic failure without coma: Secondary | ICD-10-CM | POA: Diagnosis not present

## 2018-10-05 DIAGNOSIS — N289 Disorder of kidney and ureter, unspecified: Secondary | ICD-10-CM | POA: Diagnosis not present

## 2018-10-05 DIAGNOSIS — K7290 Hepatic failure, unspecified without coma: Secondary | ICD-10-CM | POA: Diagnosis not present

## 2018-10-07 ENCOUNTER — Other Ambulatory Visit (HOSPITAL_COMMUNITY): Payer: Self-pay | Admitting: Cardiology

## 2018-10-22 DEATH — deceased

## 2019-07-08 IMAGING — DX DG CHEST 1V PORT
1 series · 1 of 1 positions shown · non-contrast
Comparison: 08/13/2018 chest radiograph

CLINICAL DATA: 83 y/o  M; short of breath.

EXAM:
PORTABLE CHEST 1 VIEW

[chest]
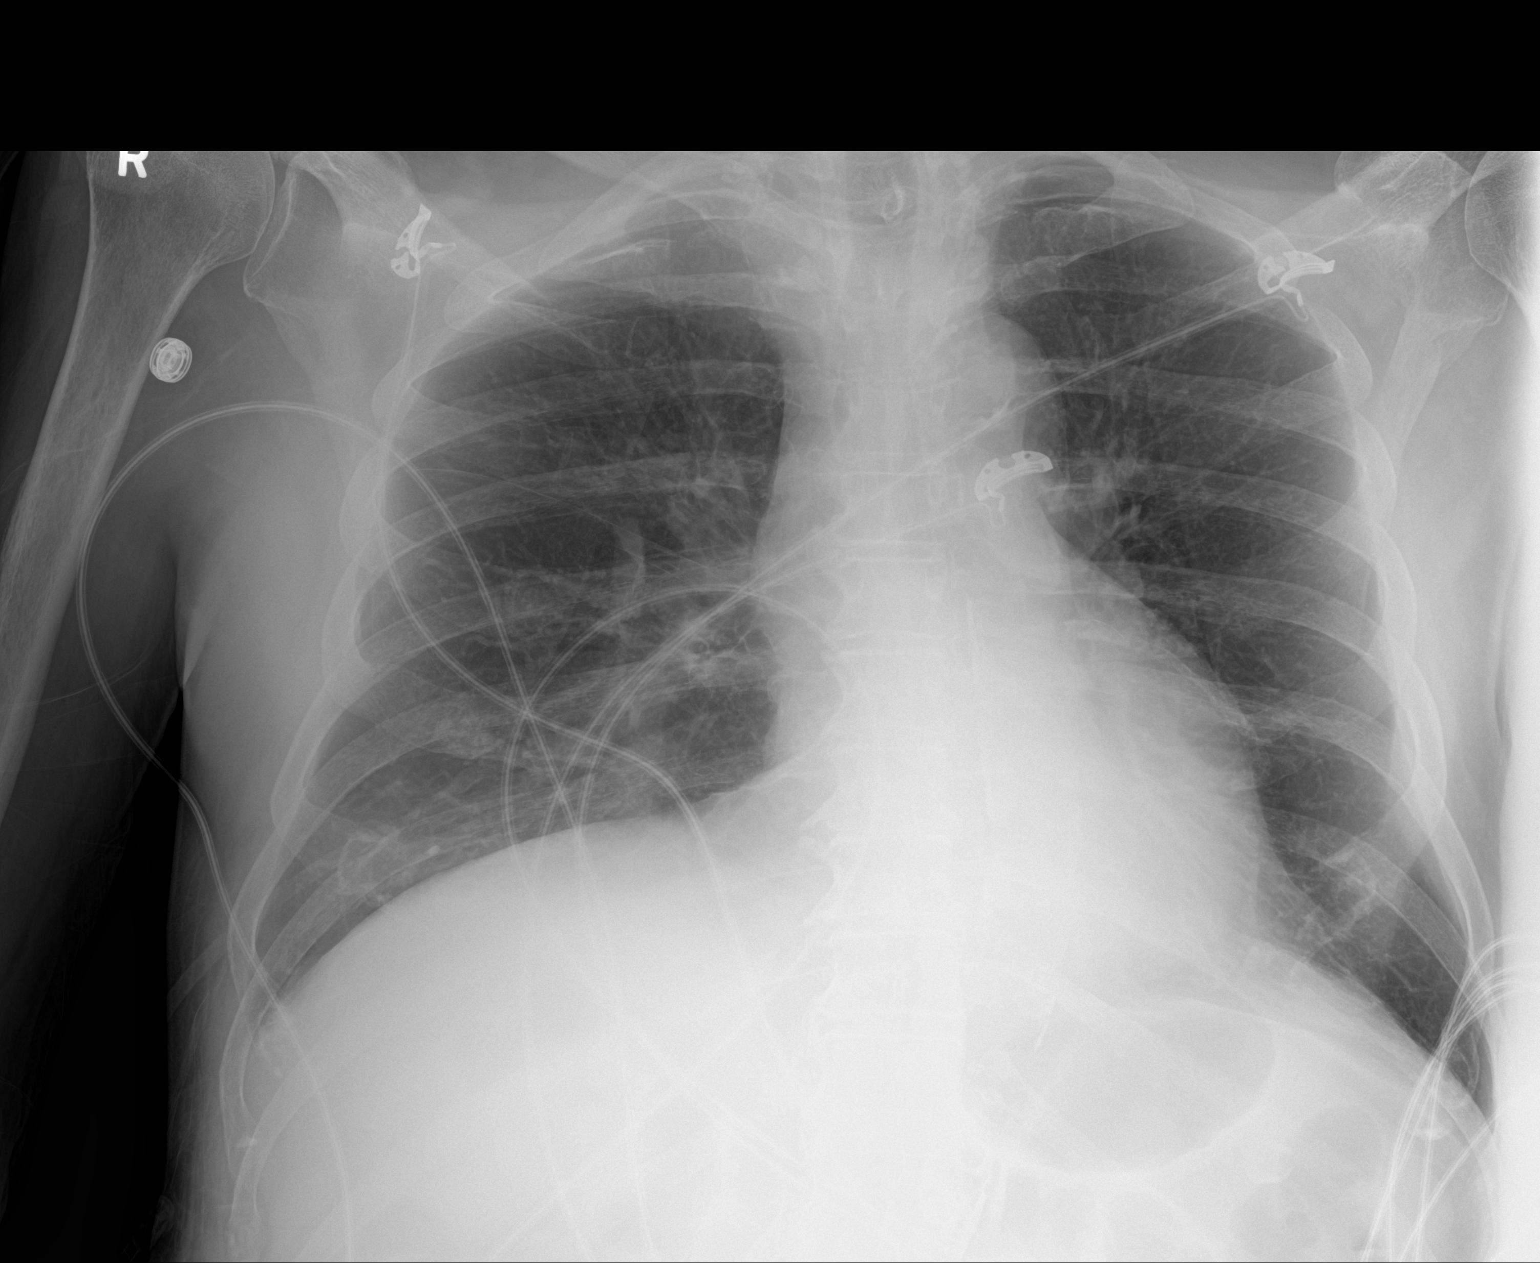

[1 of 1 positions shown; findings below may reference images not displayed]

FINDINGS: Stable heart size and mediastinal contours are within normal limits.
Both lungs are clear. The visualized skeletal structures are
unremarkable.
IMPRESSION: No active disease.
